# Patient Record
Sex: Male | Born: 1995
Health system: Southern US, Community
[De-identification: ages and names within clinical notes are randomized; demographics above are authoritative.]

## PROBLEM LIST (undated history)

## (undated) DIAGNOSIS — I10 Essential (primary) hypertension: Secondary | ICD-10-CM

## (undated) DIAGNOSIS — F419 Anxiety disorder, unspecified: Secondary | ICD-10-CM

## (undated) DIAGNOSIS — E785 Hyperlipidemia, unspecified: Secondary | ICD-10-CM

## (undated) DIAGNOSIS — J45909 Unspecified asthma, uncomplicated: Secondary | ICD-10-CM

## (undated) DIAGNOSIS — F32A Depression, unspecified: Secondary | ICD-10-CM

## (undated) DIAGNOSIS — F909 Attention-deficit hyperactivity disorder, unspecified type: Secondary | ICD-10-CM

## (undated) HISTORY — DX: Unspecified asthma, uncomplicated: J45.909

## (undated) HISTORY — DX: Anxiety disorder, unspecified: F41.9

## (undated) HISTORY — DX: Essential (primary) hypertension: I10

## (undated) HISTORY — DX: Depression, unspecified: F32.A

## (undated) HISTORY — DX: Hyperlipidemia, unspecified: E78.5

## (undated) HISTORY — DX: Attention-deficit hyperactivity disorder, unspecified type: F90.9

## (undated) HISTORY — PX: TONSILLECTOMY: SUR1361

---
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
## 2016-03-31 ENCOUNTER — Ambulatory Visit: Payer: Self-pay | Admitting: Family Medicine

## 2016-04-17 ENCOUNTER — Encounter: Payer: Self-pay | Admitting: Family Medicine

## 2016-04-17 ENCOUNTER — Ambulatory Visit (INDEPENDENT_AMBULATORY_CARE_PROVIDER_SITE_OTHER): Payer: Self-pay | Admitting: Family Medicine

## 2016-04-17 VITALS — BP 124/72 | HR 68 | Temp 98.3°F | Resp 14 | Ht 65.0 in | Wt 212.0 lb

## 2016-04-17 DIAGNOSIS — F329 Major depressive disorder, single episode, unspecified: Secondary | ICD-10-CM

## 2016-04-17 DIAGNOSIS — E669 Obesity, unspecified: Secondary | ICD-10-CM

## 2016-04-17 DIAGNOSIS — Z6379 Other stressful life events affecting family and household: Secondary | ICD-10-CM

## 2016-04-17 DIAGNOSIS — F32A Depression, unspecified: Secondary | ICD-10-CM

## 2016-04-17 DIAGNOSIS — E781 Pure hyperglyceridemia: Secondary | ICD-10-CM

## 2016-04-17 LAB — COMPREHENSIVE METABOLIC PANEL
ALBUMIN: 4.5 g/dL (ref 3.6–5.1)
ALK PHOS: 70 U/L (ref 48–230)
ALT: 17 U/L (ref 8–46)
AST: 20 U/L (ref 12–32)
BILIRUBIN TOTAL: 0.4 mg/dL (ref 0.2–1.1)
BUN: 12 mg/dL (ref 7–20)
CALCIUM: 9.3 mg/dL (ref 8.9–10.4)
CO2: 25 mmol/L (ref 20–31)
Chloride: 105 mmol/L (ref 98–110)
Creat: 0.94 mg/dL (ref 0.60–1.26)
Glucose, Bld: 75 mg/dL (ref 70–99)
Potassium: 4.4 mmol/L (ref 3.8–5.1)
Sodium: 143 mmol/L (ref 135–146)
Total Protein: 6.7 g/dL (ref 6.3–8.2)

## 2016-04-17 LAB — LIPID PANEL
CHOLESTEROL: 147 mg/dL (ref 125–170)
HDL: 30 mg/dL — ABNORMAL LOW (ref 31–65)
LDL Cholesterol: 85 mg/dL (ref <110)
TRIGLYCERIDES: 158 mg/dL — AB (ref 38–152)
Total CHOL/HDL Ratio: 4.9 Ratio (ref <=5.0)
VLDL: 32 mg/dL — ABNORMAL HIGH (ref <30)

## 2016-04-17 NOTE — Assessment & Plan Note (Signed)
He declines medication but agrees to therapy, will set up ASAP He does not have any active suicidal ideations or plans. Discussed with him that he can always go to the emergency room to get help if those thoughts were ever surface. He is a very polite young gentleman and is really trying to make a change for himself. He once into the [employer] and is currently working on getting his weight down and getting some closure with regards to his family's issues so he feels like he can move on.

## 2016-04-17 NOTE — Patient Instructions (Addendum)
Referral to therapy  School note today  F/U 2 months

## 2016-04-17 NOTE — Progress Notes (Signed)
Patient ID: Lavonna MonarchJeremy H Arakelian, male   DOB: 04/21/1996, 20 y.o.   MRN: [id number]   Subjective:    Patient ID: Lavonna MonarchJeremy H Ozburn, male    DOB: 12/07/1995, 20 y.o.   MRN: [id number]  Patient presents for Anxiety Patient to follow-up. He is history of anxiety and depressed mood. In the past we tried antidepressants but this made him feel like he was out of control at times he had suicidal thoughts. He does not want to be on any antidepressants but once referral to a therapist. He is has issues with his parents being divorced and his father doing well with his new wife and his mother is still having issues with drugs in financial stability. Also recently his sister moved out of the home with her son and the family is worried about the nephew because she is moved in with someone they think has a bad influence over her. He states that his father tells him that he is proud of him as he is now graduating from high school he is working he is also improving his health has lost weight but he states that he still does not feel like he has done the right things to deserve his approval. He states that he is followed to suffer many years and even his family doesn't know how bad he suffers with anxiety and stress over day-to-day details feeling like he's not fitting in and the divorce of his parents.    Review Of Systems:  GEN- denies fatigue, fever, weight loss,weakness, recent illness HEENT- denies eye drainage, change in vision, nasal discharge, CVS- denies chest pain, palpitations RESP- denies SOB, cough, wheeze ABD- denies N/V, change in stools, abd pain GU- denies dysuria, hematuria, dribbling, incontinence MSK- denies joint pain, muscle aches, injury Neuro- denies headache, dizziness, syncope, seizure activity       Objective:    BP 124/72 mmHg  Pulse 68  Temp(Src) 98.3 F (36.8 C) (Oral)  Resp 14  Ht 5\' 5"  (1.651 m)  Wt 212 lb (96.163 kg)  BMI 35.28 kg/m2 GEN- NAD, alert and oriented x3 Psych- very  polite, good eye contact, no SI/HI, not anxious appearing        Assessment & Plan:      Problem List Items Addressed This Visit    Obesity   Hypertriglyceridemia    Recheck labs      Relevant Orders   Lipid panel   Comprehensive metabolic panel   Depression - Primary    He declines medication but agrees to therapy, will set up ASAP He does not have any active suicidal ideations or plans. Discussed with him that he can always go to the emergency room to get help if those thoughts were ever surface. He is a very polite young gentleman and is really trying to make a change for himself. He once into the [employer] and is currently working on getting his weight down and getting some closure with regards to his family's issues so he feels like he can move on.      Relevant Orders   Ambulatory referral to Psychology    Other Visit Diagnoses    Stressful life event affecting family        Relevant Orders    Ambulatory referral to Psychology       Note: This dictation was prepared with Dragon dictation along with smaller phrase technology. Any transcriptional errors that result from this process are unintentional.

## 2016-04-17 NOTE — Assessment & Plan Note (Signed)
Recheck labs 

## 2016-04-18 ENCOUNTER — Encounter: Payer: Self-pay | Admitting: Family Medicine

## 2016-04-19 ENCOUNTER — Encounter: Payer: Self-pay | Admitting: *Deleted

## 2016-04-28 ENCOUNTER — Telehealth: Payer: Self-pay | Admitting: Family Medicine

## 2016-04-28 NOTE — Telephone Encounter (Signed)
error 

## 2016-06-19 ENCOUNTER — Ambulatory Visit: Payer: Self-pay | Admitting: Family Medicine

## 2018-09-05 ENCOUNTER — Encounter: Payer: Self-pay | Admitting: Physician Assistant

## 2018-09-05 ENCOUNTER — Ambulatory Visit (INDEPENDENT_AMBULATORY_CARE_PROVIDER_SITE_OTHER): Payer: BLUE CROSS/BLUE SHIELD | Admitting: Physician Assistant

## 2018-09-05 VITALS — BP 142/88 | HR 63 | Temp 97.6°F | Resp 16 | Ht 65.0 in | Wt 201.2 lb

## 2018-09-05 DIAGNOSIS — R040 Epistaxis: Secondary | ICD-10-CM

## 2018-09-05 NOTE — Progress Notes (Signed)
    Patient ID: Jose Vincent MRN: [id number], DOB: 1996-07-13, 22 y.o. Date of Encounter: 09/05/2018, 9:42 AM    Chief Complaint:  Chief Complaint  Patient presents with  . Epistaxis    Patient states he works in dry air at work.     HPI: 22 y.o. year old male presents with above.   He reports that he has been having episodes of epistaxis for several years but episodes are becoming more frequent. Reports that he currently is working third shift and has been working third shift at [employer] for about 1 year. States that, on average, he is now having about 3 nosebleeds per week. Reports that his grandmother has told him that his father also had nosebleeds, that he thinks that there is some hereditary component. Reports that when he is at work--- if blood gets on objects--- then it is then contaminated-- so that is an issue. Asked if he had been trying any type of treatment.  He responds that--- no--- he has been doing nothing.  He states that when the nose starts to bleed--- that it will bleed thick, heavy blood and he just lets it bleed and then it will stop spontaneously.  He does not apply pressure to the nose.  He does not apply any ice to the nose.  He does nothing.  He just lets that bleed and then it will eventually stop bleeding on its own.     Home Meds:   No outpatient medications prior to visit.   No facility-administered medications prior to visit.     Allergies:  Allergies  Allergen Reactions  . Amoxicillin-Pot Clavulanate Hives      Review of Systems: See HPI for pertinent ROS. All other ROS negative.    Physical Exam: Blood pressure (!) 158/90, pulse 63, temperature 97.6 F (36.4 C), temperature source Oral, resp. rate 16, height 5\' 5"  (1.651 m), weight 91.3 kg, SpO2 97 %., Body mass index is 33.49 kg/m. General: WM.  Appears in no acute distress. HEENT: Normocephalic, atraumatic.  Nose: Right Nares: Mucosa appears erythema, inflamed.  Left Nares:  Septal side with dried blood present. Lateral side with no blood.  No lesions visualized. Neck: Supple. No thyromegaly. No lymphadenopathy. Lungs: Clear bilaterally to auscultation without wheezes, rales, or rhonchi. Breathing is unlabored. Heart: Regular rhythm. No murmurs, rubs, or gallops. Msk:  Strength and tone normal for age. Extremities/Skin: Warm and dry. Neuro: Alert and oriented X 3. Moves all extremities spontaneously. Gait is normal. CNII-XII grossly in tact. Psych:  Responds to questions appropriately with a normal affect.     ASSESSMENT AND PLAN:  22 y.o. year old male with   1. Epistaxis I reviewed CBC from 2016 was normal with normal platelets. He is having no other bleeding so do doubt bleeding disorder. Discussed anatomy of vessels and nose with patient. Discussed that he has not even tried conservative measures of treatment.  Gust whether he wanted to try this first prior to her referral to ENT for possible cautery.  Wants me to go ahead and put in the referral to ENT.  Encouraged him to use conservative treatment measures in the interim.  Applied normal saline to the nose routinely to keep moist.  If he does develop a nosebleed needs to apply pressure and could apply ice/cold to encourage vasoconstriction. - Ambulatory referral to ENT   Signed, Frazier Richards, PA, [facility] 09/05/2018 9:42 AM

## 2018-10-17 ENCOUNTER — Ambulatory Visit (INDEPENDENT_AMBULATORY_CARE_PROVIDER_SITE_OTHER): Payer: BLUE CROSS/BLUE SHIELD | Admitting: Otolaryngology

## 2018-10-17 DIAGNOSIS — R040 Epistaxis: Secondary | ICD-10-CM | POA: Diagnosis not present

## 2018-10-21 ENCOUNTER — Ambulatory Visit (INDEPENDENT_AMBULATORY_CARE_PROVIDER_SITE_OTHER): Payer: BLUE CROSS/BLUE SHIELD | Admitting: Otolaryngology

## 2018-10-21 DIAGNOSIS — R040 Epistaxis: Secondary | ICD-10-CM

## 2018-11-18 ENCOUNTER — Ambulatory Visit (INDEPENDENT_AMBULATORY_CARE_PROVIDER_SITE_OTHER): Payer: Self-pay | Admitting: Otolaryngology

## 2018-11-28 ENCOUNTER — Ambulatory Visit (INDEPENDENT_AMBULATORY_CARE_PROVIDER_SITE_OTHER): Payer: Self-pay | Admitting: Otolaryngology

## 2019-02-04 ENCOUNTER — Ambulatory Visit (INDEPENDENT_AMBULATORY_CARE_PROVIDER_SITE_OTHER): Payer: BLUE CROSS/BLUE SHIELD | Admitting: Family Medicine

## 2019-02-04 VITALS — BP 130/80 | HR 60 | Temp 97.6°F | Resp 14 | Ht 66.0 in | Wt 211.0 lb

## 2019-02-04 DIAGNOSIS — R21 Rash and other nonspecific skin eruption: Secondary | ICD-10-CM

## 2019-02-04 MED ORDER — DICLOFENAC SODIUM 75 MG PO TBEC: 75.0000 mg | DELAYED_RELEASE_TABLET | Freq: Two times a day (BID) | ORAL | 0 refills | Status: DC

## 2019-02-04 NOTE — Progress Notes (Signed)
Subjective:    Patient ID: Jose Vincent, male    DOB: July 20, 1996, 23 y.o.   MRN: [id number]  HPI  Patient reports 1 week history of pain at the base of his penis.  The pain is adjacent to the shaft of the penis on the left-hand side.  He denies any specific injuries however the pain is worse with sexual intercourse.  It is also worse with heavy lifting.  It comes and goes.  In that area there are 3 small bumps.  Each of the bumps are approximately 1 mm in diameter.  There erythematous papules.  They do not itch or burn.  The rash just showed up on Saturday. Past Medical History:  Diagnosis Date  . ADHD (attention deficit hyperactivity disorder)    On Concerta /Vyavnse in past  . Asthma    Childhood- no inhaler since elementary age  . Hyperlipidemia    Past Surgical History:  Procedure Laterality Date  . TONSILLECTOMY     No current outpatient medications on file prior to visit.   No current facility-administered medications on file prior to visit.    Allergies  Allergen Reactions  . Amoxicillin-Pot Clavulanate Hives   Social History   Socioeconomic History  . Marital status: Single    Spouse name: Not on file  . Number of children: Not on file  . Years of education: Not on file  . Highest education level: Not on file  Occupational History  . Not on file  Social Needs  . Financial resource strain: Not on file  . Food insecurity:    Worry: Not on file    Inability: Not on file  . Transportation needs:    Medical: Not on file    Non-medical: Not on file  Tobacco Use  . Smoking status: Current Every Day Smoker    Packs/day: 0.25    Types: E-cigarettes  . Smokeless tobacco: Never Used  Substance and Sexual Activity  . Alcohol use: No    Alcohol/week: 0.0 standard drinks  . Drug use: No    Frequency: 3.0 times per week  . Sexual activity: Not Currently  Lifestyle  . Physical activity:    Days per week: Not on file    Minutes per session: Not on file  . Stress:  Not on file  Relationships  . Social connections:    Talks on phone: Not on file    Gets together: Not on file    Attends religious service: Not on file    Active member of club or organization: Not on file    Attends meetings of clubs or organizations: Not on file    Relationship status: Not on file  . Intimate partner violence:    Fear of current or ex partner: Not on file    Emotionally abused: Not on file    Physically abused: Not on file    Forced sexual activity: Not on file  Other Topics Concern  . Not on file  Social History Narrative   Live with grandmother      Review of Systems  All other systems reviewed and are negative.      Objective:   Physical Exam Vitals signs reviewed.  Constitutional:      General: He is not in acute distress.    Appearance: Normal appearance. He is not ill-appearing.  Cardiovascular:     Rate and Rhythm: Normal rate and regular rhythm.     Heart sounds: Normal heart sounds.  Pulmonary:  Effort: Pulmonary effort is normal.     Breath sounds: Normal breath sounds.  Abdominal:     Hernia: There is no hernia in the right inguinal area or left inguinal area.  Genitourinary:    Penis: Normal and circumcised. No erythema, discharge or lesions.      Scrotum/Testes:        Right: Mass, tenderness or swelling not present.        Left: Mass, tenderness or swelling not present.     Epididymis:     Right: Normal.     Left: Normal.    Lymphadenopathy:     Lower Body: No right inguinal adenopathy. No left inguinal adenopathy.  Neurological:     Mental Status: He is alert.           Assessment & Plan:  Rash of genital area - Plan: HSV(herpes smplx)abs-1+2(IgG+IgM)-bld, HIV Antibody (routine testing w rflx), RPR  I honestly believe that the pain adjacent to the shaft of his penis is likely muscular in nature.  I cannot appreciate any hernia today on exam with Valsalva.  A small hernia would also be a possibility however I think  the patient may have strained a muscle in this area and this should gradually improve over the next 1 to 2 weeks.  I recommended diclofenac 75 mg p.o. twice daily as needed for pain.  The rash looks like some 3 small heat rash bumps.  Although I cannot rule out herpes particular given the pain.  Therefore I will check a blood test for herpes.  These are not vesicular lesions however and there is no fluid to sample to run a viral culture on.  While drawing lab work, the patient would also like to be screened for HIV and syphilis.  I recommended treating the rash which I believe is a heat rash with hydrocortisone cream twice daily for 1 week and to treat the pain in the groin which I believe is muscular with diclofenac 75 mg twice daily

## 2019-02-05 LAB — HIV ANTIBODY (ROUTINE TESTING W REFLEX): HIV: NONREACTIVE

## 2019-02-05 LAB — RPR: RPR Ser Ql: NONREACTIVE

## 2019-05-26 ENCOUNTER — Telehealth: Payer: Self-pay

## 2019-05-26 ENCOUNTER — Other Ambulatory Visit: Payer: BLUE CROSS/BLUE SHIELD

## 2019-05-26 ENCOUNTER — Other Ambulatory Visit: Payer: Self-pay

## 2019-05-26 DIAGNOSIS — Z20822 Contact with and (suspected) exposure to covid-19: Secondary | ICD-10-CM

## 2019-05-26 DIAGNOSIS — R6889 Other general symptoms and signs: Secondary | ICD-10-CM | POA: Diagnosis not present

## 2019-05-26 NOTE — Telephone Encounter (Signed)
rec'd request from [facility]. HD. To schedule pt. For CoVID testing.    Called pt. Offered to schedule for COVID testing.  Pt. Stated he was sent from the nurse at work to the testing site, and already went there this morning.  Questioned if he has to continue to quarantine until test results come back.  Stated he was nauseated and had a fever of 99.1 yesterday.  Stated his stomach is still a little upset today.  Advised to continue to quarantine, until test results are confirmed.  Verb. Understanding.

## 2019-05-26 NOTE — Progress Notes (Signed)
[id number] 

## 2019-05-30 ENCOUNTER — Telehealth: Payer: Self-pay

## 2019-05-30 ENCOUNTER — Telehealth: Payer: Self-pay | Admitting: Family Medicine

## 2019-05-30 NOTE — Telephone Encounter (Signed)
Pt. Calling for results - not available.

## 2019-05-30 NOTE — Telephone Encounter (Signed)
No results as of now. Informed patient we would call with results as soon as available.

## 2019-05-31 LAB — NOVEL CORONAVIRUS, NAA: SARS-CoV-2, NAA: NOT DETECTED

## 2019-10-02 ENCOUNTER — Telehealth: Payer: Self-pay | Admitting: Family Medicine

## 2019-10-02 NOTE — Telephone Encounter (Signed)
Patient was at work last night, was vaping and all of a sudden started coughing really bad, starting sweating, starting feeling bad, his work sent him home for covid restrictions would like advice as to what to do next  [phone]

## 2019-10-02 NOTE — Telephone Encounter (Signed)
Call placed to patient to inquire.   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 Reports that he took a break around 5:30PM and was resting and vaping. States that he began to feel jittery, fatigued, broke out in a sweat, and had cough with some phlegm production. States that it feels like there is a lump in his throat when he coughs. States that he was sent home by supervisor.   Symptoms appear to be multifactorial. Patient reports that he last ate at 9am prior to going to work, and only had energy drink at 11am prior to onset of symptoms at 5:30pm. Patient also states that he has been vaping for >1 year. Suspect that he had drop is blood sugar leading to sweating and feeling jittery/ fatigued.   Reports that VS are as follows: BP 141/86, HR 71, T 98.0 Oral  States that he was not required to have COVID testing unless PCP recommended. Advised that Sx do not sound like COVID. Offered Telehealth visit with PCP to evaluate. Appointment scheduled.

## 2019-10-03 ENCOUNTER — Ambulatory Visit (INDEPENDENT_AMBULATORY_CARE_PROVIDER_SITE_OTHER): Payer: BC Managed Care – PPO | Admitting: Family Medicine

## 2019-10-03 ENCOUNTER — Encounter: Payer: Self-pay | Admitting: Family Medicine

## 2019-10-03 ENCOUNTER — Other Ambulatory Visit: Payer: Self-pay

## 2019-10-03 DIAGNOSIS — R110 Nausea: Secondary | ICD-10-CM

## 2019-10-03 DIAGNOSIS — R05 Cough: Secondary | ICD-10-CM

## 2019-10-03 DIAGNOSIS — Z20822 Contact with and (suspected) exposure to covid-19: Secondary | ICD-10-CM

## 2019-10-03 DIAGNOSIS — Z20828 Contact with and (suspected) exposure to other viral communicable diseases: Secondary | ICD-10-CM

## 2019-10-03 DIAGNOSIS — R059 Cough, unspecified: Secondary | ICD-10-CM

## 2019-10-03 MED ORDER — ONDANSETRON 4 MG PO TBDP: 4.0000 mg | ORAL_TABLET | Freq: Three times a day (TID) | ORAL | 0 refills | Status: DC | PRN

## 2019-10-03 NOTE — Telephone Encounter (Signed)
noted 

## 2019-10-03 NOTE — Progress Notes (Signed)
Virtual Visit via Telephone Note  I connected with Jose Vincent on 10/03/19 at 11:02am  by telephone and verified that I am speaking with the correct person using two identifiers.      Pt location: at home   Physician location:  In office, [facility], Vic Blackbird MD     On call: patient and physician   I discussed the limitations, risks, security and privacy concerns of performing an evaluation and management service by telephone and the availability of in person appointments. I also discussed with the patient that there may be a patient responsible charge related to this service. The patient expressed understanding and agreed to proceed.   History of Present Illness:  He works outside. He was on his break early Thursday AM ( he works night shift) and he was vaping, then all of a sudden a felt a lump in his throat and felt like he couldn't breathe, he started coughing a lot then became sweaty and nausea. He felt fine before he went into his break at work and had used his vape earlier that day.  The spell lasted 10-15 minuts He has had COVID-19 at his work and has been in close proximetry with someone The lump in his throat improved yesterday, but still felt nauseated,  No further coughing  No vomiting or diarrhea  Temperature was  98.54F 145/81 at home  He does not take any meds or other supplements  M-F work night   His nausea however has persisted last night states that he just felt bad but cannot pinpoint anything particular.  Today he has not eaten anything because of the nausea.  He still does not feel quite himself.     Observations/Objective: No acute distress noted over the phone  Assessment and Plan: Nausea with coughing episode yesterday this could have been precipitated by his vape device.  However he has persistent nausea and general malaise with Covid positive in his workplace I recommend he go get Covid testing.  Have also sent over Zofran for the  nausea.  Otherwise supportive care with fluids and rest.  Patient voiced understanding  Follow Up Instructions:    I discussed the assessment and treatment plan with the patient. The patient was provided an opportunity to ask questions and all were answered. The patient agreed with the plan and demonstrated an understanding of the instructions.   The patient was advised to call back or seek an in-person evaluation if the symptoms worsen or if the condition fails to improve as anticipated.  I provided  13  minutes of non-face-to-face time during this encounter. End Time 11:15am   Vic Blackbird, MD

## 2019-10-05 LAB — NOVEL CORONAVIRUS, NAA: SARS-CoV-2, NAA: NOT DETECTED

## 2019-10-06 ENCOUNTER — Telehealth: Payer: Self-pay

## 2019-10-06 NOTE — Telephone Encounter (Signed)
Received call from patient checking Covid results.  Advised results negative.   

## 2019-11-11 ENCOUNTER — Encounter: Payer: Self-pay | Admitting: Family Medicine

## 2019-11-11 ENCOUNTER — Ambulatory Visit (INDEPENDENT_AMBULATORY_CARE_PROVIDER_SITE_OTHER): Payer: Self-pay | Admitting: Family Medicine

## 2019-11-11 ENCOUNTER — Other Ambulatory Visit: Payer: Self-pay

## 2019-11-11 DIAGNOSIS — J029 Acute pharyngitis, unspecified: Secondary | ICD-10-CM

## 2019-11-11 NOTE — Progress Notes (Signed)
Virtual Visit via Telephone Note  I connected with Jose Vincent on 11/11/19 at8:25am  by telephone and verified that I am speaking with the correct person using two identifiers.      Pt location: at home   Physician location:  In office, [facility], Vic Blackbird MD     On call: patient and physician   I discussed the limitations, risks, security and privacy concerns of performing an evaluation and management service by telephone and the availability of in person appointments. I also discussed with the patient that there may be a patient responsible charge related to this service. The patient expressed understanding and agreed to proceed.   History of Present Illness: Patient called in because of sore throat last week.  Last Tuesday he started having some mild sore throat states that he was working outside for his job and the air was cold.  He had some mild congestion and drainage.  His throat was sore for a few days but it worsened on Friday where he had some discomfort swallowing but then it went away on its own.  He has not had any sore throat for the past 4 days.  He never had any significant congestion, cough, fever ,body aches nausea vomiting loss of taste or smell or GI symptoms.  His work had him stay out of work until he was evaluated by me today.  Not sent for Covid testing.  He is actually been Covid tested twice the last was in October.  He has not had any known Covid contacts recently.  He feels fine needs note to return to work Observations/Objective: No acute distress noted over the phone  Assessment and Plan: Pharyngitis suspect this could have come from some postnasal drip he was working outside in the cold air.  He never had any other viral symptoms.  His symptoms have actually already resolved for the past 4 days.  I think that he can return to work.  I do not think that he needs Covid testing at this time.  I did advise him if he does get new symptoms to let  us know.  Follow Up Instructions:    I discussed the assessment and treatment plan with the patient. The patient was provided an opportunity to ask questions and all were answered. The patient agreed with the plan and demonstrated an understanding of the instructions.   The patient was advised to call back or seek an in-person evaluation if the symptoms worsen or if the condition fails to improve as anticipated.  I provided 5 minutes of non-face-to-face time during this encounter. End Time 8:30am  Vic Blackbird, MD

## 2020-02-25 ENCOUNTER — Ambulatory Visit: Payer: Managed Care, Other (non HMO) | Attending: Internal Medicine

## 2020-02-25 ENCOUNTER — Telehealth (INDEPENDENT_AMBULATORY_CARE_PROVIDER_SITE_OTHER): Payer: Managed Care, Other (non HMO) | Admitting: Nurse Practitioner

## 2020-02-25 DIAGNOSIS — Z20822 Contact with and (suspected) exposure to covid-19: Secondary | ICD-10-CM | POA: Insufficient documentation

## 2020-02-25 DIAGNOSIS — B349 Viral infection, unspecified: Secondary | ICD-10-CM

## 2020-02-25 NOTE — Progress Notes (Signed)
Virtual Note    I connected withJeremy Marcell Vincent  on 02/25/2020 at 1112 by videoand verified that I am speaking with the correct person using two identifiers.  Pt location: at home   Physician location:  In office, [place], Ishmael Holter, FNP-C    On call: patient and physician   I discussed the limitations, risks, security and privacy concerns of performing an evaluation and management service by telephone and the availability of in person appointments. I also discussed with the patient that there may be a patient responsible charge related to this service. The patient expressed understanding and agreed to proceed.   History of Present Illness: Pt is a 24 year old caucasian male presenting for telephone visit related to onset of sxs nausea, general body aches, sneezing and h/a same as prior headaches started last night.  He does note that he experienced hot flashes yesterday afternoon. This AM upon waking he had sore throat that has now resolved. No fever/chills, nasal congestion. No loss of taste or smell. No other gu/gi sxs, no sick contacts. Pt has tried no treatment.   Observations/Objective: NAD noted, able to speak in full sentences, no visual sob or use of accessory muscles to breathe, not ill appearing  Assessment and Plan: 1. Your sxs are consistent with a Viral illness 2. Covid test today number for [facility] testing provided 3. May take over the counter medications for sxs relief such as tylenol/ibuprofen 4. Get plenty of fluids and rest  Symptoms vary depending on the type of virus and the location of the cells that it invades. Common symptoms of the main types of viral illnesses include: Cold and flu viruses  Fever.  Headache.  Sore throat.  Muscle aches.  Nasal congestion.  Cough. Digestive system (gastrointestinal) viruses  Fever.  Abdominal pain.  Nausea.  Diarrhea. Liver viruses (hepatitis)  Loss of  appetite.  Tiredness.  Yellowing of the skin (jaundice). Brain and spinal cord viruses  Fever.  Headache.  Stiff neck.  Nausea and vomiting.  Confusion or sleepiness. Skin viruses  Warts.  Itching.  Rash. Sexually transmitted viruses  Discharge.  Swelling.  Redness.  Rash. How is this treated? Viruses can be difficult to treat because they live within cells. Antibiotic medicines do not treat viruses because these drugs do not get inside cells. Treatment for a viral illness may include:  Resting and drinking plenty of fluids.  Medicines to relieve symptoms. These can include over-the-counter medicine for pain and fever, medicines for cough or congestion, and medicines to relieve diarrhea.  Antiviral medicines. These drugs are available only for certain types of viruses. They may help reduce flu symptoms if taken early. There are also many antiviral medicines for hepatitis and HIV/AIDS. Some viral illnesses can be prevented with vaccinations. A common example is the flu shot. Follow these instructions at home: Medicines    Take over-the-counter and prescription medicines only as told by your health care provider.  If you were prescribed an antiviral medicine, take it as told by your health care provider. Do not stop taking the medicine even if you start to feel better.  Be aware of when antibiotics are needed and when they are not needed. Antibiotics do not treat viruses. If your health care provider thinks that you may have a bacterial infection as well as a viral infection, you may get an antibiotic. ? Do not ask for an antibiotic prescription if you have been diagnosed with a viral illness. That will  not make your illness go away faster. ? Frequently taking antibiotics when they are not needed can lead to antibiotic resistance. When this develops, the medicine no longer works against the bacteria that it normally fights. General instructions  Drink enough  fluids to keep your urine clear or pale yellow.  Rest as much as possible.  Return to your normal activities as told by your health care provider. Ask your health care provider what activities are safe for you.  Keep all follow-up visits as told by your health care provider. This is important. How is this prevented? Take these actions to reduce your risk of viral infection:     Eat a healthy diet and get enough rest.  Wash your hands often with soap and water. This is especially important when you are in public places. If soap and water are not available, use hand sanitizer.  Avoid close contact with friends and family who have a viral illness.  If you travel to areas where viral gastrointestinal infection is common, avoid drinking water or eating raw food.  Keep your immunizations up to date. Get a flu shot every year as told by your health care provider.  Do not share toothbrushes, nail clippers, razors, or needles with other people.  Always practice safe sex.  Contact a health care provider if:  You have symptoms of a viral illness that do not go away.  Your symptoms come back after going away.  Your symptoms get worse. Get help right away if:  You have trouble breathing.  You have a severe headache or a stiff neck.  You have severe vomiting or abdominal pain. This information is not intended to replace advice given to you by your health care provider. Make sure you discuss any questions you have with your health care provider. Document Revised: 11/02/2017 Document Reviewed: 03/31/2016 Elsevier Patient Education  2020 [employer].   Follow Up Instructions: as needed for worsening/non resolving sxs.  I discussed the assessment and treatment plan with the patient. The patient was provided an opportunity to ask questions and all were answered. The patient agreed with the plan and demonstrated an understanding of the instructions.  The patient was advised to  call back or seek an in-person evaluation if the symptoms worsen or if the condition fails to improve as anticipated.  I provided 10 minutes of non-face-to-face time during this encounter. End Time 1122   Lawson Fiscal, FNP-C

## 2020-02-26 LAB — SARS-COV-2, NAA 2 DAY TAT

## 2020-02-26 LAB — NOVEL CORONAVIRUS, NAA: SARS-CoV-2, NAA: NOT DETECTED

## 2020-08-11 ENCOUNTER — Telehealth: Payer: Managed Care, Other (non HMO) | Admitting: Nurse Practitioner

## 2020-08-11 ENCOUNTER — Other Ambulatory Visit: Payer: Self-pay

## 2020-08-11 ENCOUNTER — Telehealth: Payer: Self-pay | Admitting: Nurse Practitioner

## 2020-08-11 DIAGNOSIS — Z20822 Contact with and (suspected) exposure to covid-19: Secondary | ICD-10-CM | POA: Diagnosis not present

## 2020-08-11 NOTE — Progress Notes (Signed)
Virtual Visit via Video Note  I connected with Jose Vincent on 08/11/20 at 10:30 AM EDT by a video enabled telemedicine application and verified that I am speaking with the correct person using two identifiers.   I discussed the limitations of evaluation and management by telemedicine and the availability of in person appointments. The patient expressed understanding and agreed to proceed. Pt was at home and Provider located at clinic [facility].   History of Present Illness: Pt is a 24 year old male presenting for a telephone visit related to being exposed to [place]. He is not having any sxs. His exposure was to grandmother with confirmed COVID positive on Saturday. He was instructed by work to get tested. Also his girlfriend has sxs of body pain not been tested yet.   Not vaccinated.  Time spent discussing exposure, Infection control, pts boss's instruction, CDC COVID guidelines  Observations/Objective:  Past Medical History:  Diagnosis Date  . ADHD (attention deficit hyperactivity disorder)    On Concerta /Vyavnse in past  . Asthma    Childhood- no inhaler since elementary age  . Hyperlipidemia    Past Surgical History:  Procedure Laterality Date  . TONSILLECTOMY     No current outpatient medications on file prior to visit.   No current facility-administered medications on file prior to visit.   Immunization History  Administered Date(s) Administered  . DTaP 08/12/1997, 10/12/1997, 12/23/1997, 06/22/1998, 03/24/2002  . Hepatitis B 1997/09/07, 08/12/1997, 12/23/1997  . HiB (PRP-OMP) 08/12/1997, 10/12/1997, 12/23/1997, 06/22/1998  . IPV 08/12/1997, 10/12/1997, 03/24/2002  . MMR 06/22/1998, 03/24/2002  . Meningococcal B, OMV 02/01/2015, 07/02/2015  . Meningococcal Conjugate 02/01/2015  . Td 08/12/2009  . Varicella 07/02/2015   Allergies  Allergen Reactions  . Amoxicillin Hives  . Amoxicillin-Pot Clavulanate Hives     Assessment and Plan: Close  exposure to COVID-19 virus  Time spent in consultation 15 minutes quarantine for 14 days from onset of your family member who tested positive systoms onset. If you develop sxs during that time you should COVID test. If you test positive your quarentine 14 day will start over from onset of your symptoms.   Follow Up Instructions:    I discussed the assessment and treatment plan with the patient. The patient was provided an opportunity to ask questions and all were answered. The patient agreed with the plan and demonstrated an understanding of the instructions.   The patient was advised to call back or seek an in-person evaluation if the symptoms worsen or if the condition fails to improve as anticipated.  I provided 15 minutes of non-face-to-face time during this encounter. 10:45  Annie Main, FNP

## 2020-08-11 NOTE — Telephone Encounter (Signed)
Please add work note: exposure to family member with covid. Quarantine for 14 days from 08/06/2020.

## 2020-08-11 NOTE — Telephone Encounter (Signed)
Letter sent to pt

## 2020-09-20 ENCOUNTER — Emergency Department (HOSPITAL_COMMUNITY)
Admission: EM | Admit: 2020-09-20 | Discharge: 2020-09-20 | Disposition: A | Discharge status: Home / Self Care | Payer: Managed Care, Other (non HMO) | Source: Home / Self Care | Attending: Emergency Medicine | Admitting: Emergency Medicine

## 2020-09-20 ENCOUNTER — Emergency Department (HOSPITAL_COMMUNITY): Payer: Managed Care, Other (non HMO)

## 2020-09-20 ENCOUNTER — Other Ambulatory Visit: Payer: Self-pay

## 2020-09-20 ENCOUNTER — Emergency Department (HOSPITAL_COMMUNITY)
Admission: EM | Admit: 2020-09-20 | Discharge: 2020-09-20 | Disposition: A | Discharge status: Home / Self Care | Payer: Managed Care, Other (non HMO) | Attending: Emergency Medicine | Admitting: Emergency Medicine

## 2020-09-20 ENCOUNTER — Encounter (HOSPITAL_COMMUNITY): Payer: Self-pay

## 2020-09-20 ENCOUNTER — Encounter (HOSPITAL_COMMUNITY): Payer: Self-pay | Admitting: Emergency Medicine

## 2020-09-20 DIAGNOSIS — J45909 Unspecified asthma, uncomplicated: Secondary | ICD-10-CM | POA: Insufficient documentation

## 2020-09-20 DIAGNOSIS — Y9289 Other specified places as the place of occurrence of the external cause: Secondary | ICD-10-CM | POA: Insufficient documentation

## 2020-09-20 DIAGNOSIS — F17290 Nicotine dependence, other tobacco product, uncomplicated: Secondary | ICD-10-CM | POA: Insufficient documentation

## 2020-09-20 DIAGNOSIS — T1490XA Injury, unspecified, initial encounter: Secondary | ICD-10-CM

## 2020-09-20 DIAGNOSIS — Y9389 Activity, other specified: Secondary | ICD-10-CM | POA: Insufficient documentation

## 2020-09-20 DIAGNOSIS — S82424A Nondisplaced transverse fracture of shaft of right fibula, initial encounter for closed fracture: Secondary | ICD-10-CM | POA: Diagnosis not present

## 2020-09-20 DIAGNOSIS — S93401A Sprain of unspecified ligament of right ankle, initial encounter: Secondary | ICD-10-CM | POA: Insufficient documentation

## 2020-09-20 DIAGNOSIS — X501XXA Overexertion from prolonged static or awkward postures, initial encounter: Secondary | ICD-10-CM | POA: Diagnosis not present

## 2020-09-20 DIAGNOSIS — W34011A Accidental discharge of paintball gun, initial encounter: Secondary | ICD-10-CM | POA: Insufficient documentation

## 2020-09-20 DIAGNOSIS — S82464D Nondisplaced segmental fracture of shaft of right fibula, subsequent encounter for closed fracture with routine healing: Secondary | ICD-10-CM

## 2020-09-20 DIAGNOSIS — S99921A Unspecified injury of right foot, initial encounter: Secondary | ICD-10-CM | POA: Diagnosis present

## 2020-09-20 DIAGNOSIS — S99811A Other specified injuries of right ankle, initial encounter: Secondary | ICD-10-CM

## 2020-09-20 MED ORDER — KETOROLAC TROMETHAMINE 60 MG/2ML IM SOLN
60.0000 mg | Freq: Once | INTRAMUSCULAR | Status: AC
  Administered 2020-09-20: 60 mg via INTRAMUSCULAR
  Filled 2020-09-20: qty 2

## 2020-09-20 MED ORDER — KETOROLAC TROMETHAMINE 10 MG PO TABS: 10.0000 mg | ORAL_TABLET | Freq: Four times a day (QID) | ORAL | 0 refills | Status: DC | PRN

## 2020-09-20 NOTE — ED Triage Notes (Signed)
Pt reports he rolled his right foot playing paintball x 1 day ago, moderate swelling and bruising noted

## 2020-09-20 NOTE — ED Triage Notes (Signed)
Pt presents to ED for right foot pain. Pt states he heard a crunch. Evelena Leyden PA in triage to see pt. +pedal pulse noted.

## 2020-09-20 NOTE — Discharge Instructions (Addendum)
You have been given a cam walker rather than crutches which will allow for better mobility.  Please follow-up with Dr. Romeo Apple, orthopedics.  I would also like you to follow-up with your primary care provider regarding today's encounter.  Please continue to elevate your affected leg.  Take Tylenol or ibuprofen as needed for pain symptoms.  Return to the ED or seek immediate medical attention should you experience any new or worsening symptoms.

## 2020-09-20 NOTE — ED Provider Notes (Signed)
[facility] EMERGENCY DEPARTMENT Provider Note   CSN: [id number] Arrival date & time: 09/20/20  1117     History Chief Complaint  Patient presents with  . Foot Injury    NYLE LIMB is a 24 y.o. male with no relevant past medical history presents the ED after rolling his ankle yesterday while playing paint ball.  Patient reports that he was in the woods when he everted his right foot.  He was able to ambulate yesterday, albeit with pain and discomfort.  Today he continued to endorse pain symptoms and had his friend drive him here to the ED for evaluation.  He endorses discomfort with weightbearing, tenderness, and feeling as though something is "pushing out" from the outside of his lower right leg.  He states that he has had swelling and bruising.  He denies any other injury.  No weakness.  His ROM is limited only by discomfort.  He does endorse diminished sensation in his toes.  No other symptoms or complaints.  Pain is relatively well controlled with ibuprofen.  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  HPI     Past Medical History:  Diagnosis Date  . ADHD (attention deficit hyperactivity disorder)    On Concerta /Vyavnse in past  . Asthma    Childhood- no inhaler since elementary age  . Hyperlipidemia     Patient Active Problem List   Diagnosis Date Noted  . Tobacco use disorder 10/01/2015  . Depression 07/02/2015  . Hypertriglyceridemia 06/08/2014  . Routine infant or child health check 03/06/2014  . Obesity 03/06/2014  . Flat foot 03/06/2014    Past Surgical History:  Procedure Laterality Date  . TONSILLECTOMY         Family History  Problem Relation Age of Onset  . Depression Mother   . Hyperlipidemia Father   . Hypertension Father   . Hyperlipidemia Maternal Grandmother   . Cancer Paternal Grandmother   . Hypertension Paternal Grandmother   . COPD Paternal Grandfather   . Depression Paternal Grandfather   . Heart disease  Paternal Grandfather   . Hypertension Paternal Grandfather   . Mental illness Paternal Grandfather     Social History   Tobacco Use  . Smoking status: Current Every Day Smoker    Packs/day: 0.25    Types: E-cigarettes  . Smokeless tobacco: Never Used  Vaping Use  . Vaping Use: Never used  Substance Use Topics  . Alcohol use: No    Alcohol/week: 0.0 standard drinks  . Drug use: No    Frequency: 3.0 times per week    Home Medications Prior to Admission medications   Not on File    Allergies    Amoxicillin and Amoxicillin-pot clavulanate  Review of Systems   Review of Systems  All other systems reviewed and are negative.   Physical Exam Updated Vital Signs BP (!) 137/91 (BP Location: Right Arm)   Pulse 83   Temp 98.1 F (36.7 C) (Oral)   Resp 20   Ht 5\' 7"  (1.702 m)   Wt 99.8 kg   SpO2 100%   BMI 34.46 kg/m   Physical Exam Vitals and nursing note reviewed. Exam conducted with a chaperone present.  Constitutional:      Appearance: Normal appearance.  HENT:     Head: Normocephalic and atraumatic.  Eyes:     General: No scleral icterus.    Conjunctiva/sclera: Conjunctivae normal.  Cardiovascular:     Rate and Rhythm: Normal rate.  Pulses: Normal pulses.  Pulmonary:     Effort: Pulmonary effort is normal.  Musculoskeletal:     Comments: Right leg: TTP over distal third of fibula.  Mild swelling, but compartments are soft.  No tibial TTP.  ROM of knee and hip fully intact.  Dorsiflexion and plantar flexion of right ankle mildly limited due to pain and swelling.  Ecchymoses noted under medial malleolus.  No significant TTP involving foot or ankle.  Sensation intact distally.  Pedal pulse intact and symmetric with contralateral side.  Capillary refill less than 2 seconds.  Skin:    General: Skin is dry.     Capillary Refill: Capillary refill takes less than 2 seconds.  Neurological:     Mental Status: He is alert and oriented to person, place, and time.      GCS: GCS eye subscore is 4. GCS verbal subscore is 5. GCS motor subscore is 6.  Psychiatric:        Mood and Affect: Mood normal.        Behavior: Behavior normal.        Thought Content: Thought content normal.     ED Results / Procedures / Treatments   Labs (all labs ordered are listed, but only abnormal results are displayed) Labs Reviewed - No data to display  EKG None  Radiology DG Ankle Complete Right  Result Date: 09/20/2020 CLINICAL DATA:  Right foot and ankle pain, swelling and bruising since a slip and fall yesterday playing paint ball. Initial encounter. EXAM: RIGHT ANKLE - COMPLETE 3+ VIEW COMPARISON:  None. FINDINGS: The patient has an acute fracture of the distal diaphysis of the fibula with a butterfly fragment posteriorly. The fracture is minimally displaced. Soft tissues are swollen IMPRESSION: Acute fracture of the distal diaphysis of the right fibula with soft tissue swelling as described. Electronically Signed   By: Drusilla Kanner M.D.   On: 09/20/2020 11:53   DG Foot Complete Right  Result Date: 09/20/2020 CLINICAL DATA:  Right foot and ankle pain, swelling and bruising since a slip and fall yesterday playing paint ball. Initial encounter. EXAM: RIGHT FOOT COMPLETE - 3+ VIEW COMPARISON:  None. FINDINGS: Acute distal fibular fracture is partially visualized. There is no fracture or dislocation of the foot. There is no evidence of arthropathy or other focal bone abnormality. Soft tissues of the foot are swollen. IMPRESSION: Acute distal fibular fracture.  Negative for foot fracture. Soft tissue swelling about the foot. Electronically Signed   By: Drusilla Kanner M.D.   On: 09/20/2020 11:51    Procedures Procedures (including critical care time)  Medications Ordered in ED Medications - No data to display  ED Course  I have reviewed the triage vital signs and the nursing notes.  Pertinent labs & imaging results that were available during my care of the  patient were reviewed by me and considered in my medical decision making (see chart for details).    MDM Rules/Calculators/A&P                          Patient sustained right foot eversion injury.  Plain films obtained of foot and ankle are personally reviewed and demonstrate an acute distal fibular fracture.  No tenderness appreciated over proximal fibula or tibia.  ROM of knee fully intact.  No other obvious injuries.  No other fractures or other osseous abnormalities seen on plain films.  Patient states that he has been weightbearing, albeit with discomfort.  He is neurovascularly intact, albeit with mildly diminished sensation in his toes which is likely related to his inflammation.  Will place in cam walker and have him follow-up with local orthopedist, Dr. Romeo Apple.  Patient can also follow-up with his primary care provider regarding today's encounter.  He is not requesting pain medications at this time and feels as though his symptoms are managed well with Tylenol and ibuprofen at home.  ED return precautions discussed, particularly for worsening swelling/pain/pulselessness/numbness that might be otherwise concerning for developing compartment syndrome.  Compartments soft at this time.  Patient voices understanding and is agreeable to the plan.   Final Clinical Impression(s) / ED Diagnoses Final diagnoses:  Injury  Supination-eversion injury of ankle, right, initial encounter  Closed nondisplaced transverse fracture of shaft of right fibula, initial encounter    Rx / DC Orders ED Discharge Orders    None       Lorelee New, PA-C 09/20/20 1245    Blane Ohara, MD 09/20/20 1547

## 2020-09-20 NOTE — Discharge Instructions (Signed)
Please continue to elevate your affected leg.  You can apply ice to the affected area.  Please take the prescribed Toradol, as directed.  Do not combine with other NSAIDs.  Continue take Tylenol for additional pain relief.  Do not use the cam walker as this is obviously exacerbating your pain symptoms.  Instead, use the crutches.  Weightbearing only as tolerated, preferably avoid until you are evaluated by orthopedics in 3 days.  Please go to your appointment, as scheduled.  Return to the ED or seek immediate medical attention should you experience any new or worsening symptoms.

## 2020-09-20 NOTE — ED Notes (Signed)
Entered room and introduced self to patient at this time. Pt appears to be resting in bed with no obvious signs of distress noted. Bed is locked in the lowest position, side rails x2, call bell within reach. Pt provided with a ice pack per order at this time. Educated on call light and hourly and rounding and is in agreement. All questions and concerns voiced addressed at this time.

## 2020-09-20 NOTE — ED Provider Notes (Addendum)
[facility] EMERGENCY DEPARTMENT Provider Note   CSN: [id number] Arrival date & time: 09/20/20  1555     History Chief Complaint  Patient presents with  . Foot Pain    Jose Vincent is a 24 y.o. male who returns to the ED.  This provider personally evaluated patient a few hours prior after he sustained mechanical injury while playing paint ball.  Plain films were obtained of foot and ankle which demonstrated an acute distal fibular fracture.  There was no other injury or pain complaints.  He was neurovascularly intact and would weight-bear, albeit with discomfort.  He was not requesting any pain medications at that time and felt as though his symptoms were well managed with Tylenol and ibuprofen.  Patient's compartments were soft and this evaluator had low suspicion for any other emergent pathology.  After discussion with Dr. Jodi Mourning, patient was given a cam walker and crutches and encouraged to follow-up with local orthopedist, Dr. Romeo Apple.  On new examination, patient is stating that his pain symptoms and inflammation is worsened.  He states that he is already reached out to Dr. Romeo Apple and is in the process of scheduling appointment for ongoing evaluation and management.  He tells me that he was carrying his discharge papers when he stepped down on the cam walker and heard a crunch.  He suspects that this may be attributed to his discharge papers that he was holding in his hand, but given his progressively worsening pain symptoms is concerned.  He also admits that he did adjust his cam walker in an attempt to make fit better shortly after he was discharged from the ER.  Patient tells me that he only has taken Tylenol and does not think he actually has NSAIDs at home.  He is requesting a prescription for NSAIDs.  He tells me that he does not want any narcotic pain medication as he has friends who have become addicted.  HPI     Past Medical History:  Diagnosis Date  . ADHD (attention  deficit hyperactivity disorder)    On Concerta /Vyavnse in past  . Asthma    Childhood- no inhaler since elementary age  . Hyperlipidemia     Patient Active Problem List   Diagnosis Date Noted  . Tobacco use disorder 10/01/2015  . Depression 07/02/2015  . Hypertriglyceridemia 06/08/2014  . Routine infant or child health check 03/06/2014  . Obesity 03/06/2014  . Flat foot 03/06/2014    Past Surgical History:  Procedure Laterality Date  . TONSILLECTOMY         Family History  Problem Relation Age of Onset  . Depression Mother   . Hyperlipidemia Father   . Hypertension Father   . Hyperlipidemia Maternal Grandmother   . Cancer Paternal Grandmother   . Hypertension Paternal Grandmother   . COPD Paternal Grandfather   . Depression Paternal Grandfather   . Heart disease Paternal Grandfather   . Hypertension Paternal Grandfather   . Mental illness Paternal Grandfather     Social History   Tobacco Use  . Smoking status: Current Every Day Smoker    Packs/day: 0.25    Types: E-cigarettes  . Smokeless tobacco: Never Used  Vaping Use  . Vaping Use: Never used  Substance Use Topics  . Alcohol use: No    Alcohol/week: 0.0 standard drinks  . Drug use: No    Frequency: 3.0 times per week    Home Medications Prior to Admission medications   Not on File  Allergies    Amoxicillin and Amoxicillin-pot clavulanate  Review of Systems   Review of Systems  All other systems reviewed and are negative.   Physical Exam Updated Vital Signs BP 138/80 (BP Location: Right Arm)   Pulse (!) 59   Temp 97.6 F (36.4 C) (Oral)   Resp 20   Ht 5\' 7"  (1.702 m)   Wt 99.8 kg   SpO2 97%   BMI 34.46 kg/m   Physical Exam Vitals and nursing note reviewed. Exam conducted with a chaperone present.  Constitutional:      Appearance: Normal appearance.  HENT:     Head: Normocephalic and atraumatic.  Eyes:     General: No scleral icterus.    Conjunctiva/sclera: Conjunctivae  normal.  Pulmonary:     Effort: Pulmonary effort is normal.  Musculoskeletal:     Comments: Left foot/ankle: No significant changes when compared to physical exam earlier today.  Compartments remain soft.  Pedal pulse remains intact and symmetric with contralateral foot.  Persistent developing ecchymoses.  No tenting of fibular fracture.  Can wiggle toes.  Capillary refill less than 2 seconds.  Sensation grossly intact.  Tenderness over distal fibula.  Skin:    General: Skin is dry.  Neurological:     Mental Status: He is alert.     GCS: GCS eye subscore is 4. GCS verbal subscore is 5. GCS motor subscore is 6.  Psychiatric:        Mood and Affect: Mood normal.        Behavior: Behavior normal.        Thought Content: Thought content normal.      ED Results / Procedures / Treatments   Labs (all labs ordered are listed, but only abnormal results are displayed) Labs Reviewed - No data to display  EKG None  Radiology DG Ankle Complete Right  Result Date: 09/20/2020 CLINICAL DATA:  Right foot and ankle pain, swelling and bruising since a slip and fall yesterday playing paint ball. Initial encounter. EXAM: RIGHT ANKLE - COMPLETE 3+ VIEW COMPARISON:  None. FINDINGS: The patient has an acute fracture of the distal diaphysis of the fibula with a butterfly fragment posteriorly. The fracture is minimally displaced. Soft tissues are swollen IMPRESSION: Acute fracture of the distal diaphysis of the right fibula with soft tissue swelling as described. Electronically Signed   By: 09/22/2020 M.D.   On: 09/20/2020 11:53   DG Foot Complete Right  Result Date: 09/20/2020 CLINICAL DATA:  Right foot and ankle pain, swelling and bruising since a slip and fall yesterday playing paint ball. Initial encounter. EXAM: RIGHT FOOT COMPLETE - 3+ VIEW COMPARISON:  None. FINDINGS: Acute distal fibular fracture is partially visualized. There is no fracture or dislocation of the foot. There is no evidence of  arthropathy or other focal bone abnormality. Soft tissues of the foot are swollen. IMPRESSION: Acute distal fibular fracture.  Negative for foot fracture. Soft tissue swelling about the foot. Electronically Signed   By: 09/22/2020 M.D.   On: 09/20/2020 11:51    Procedures Procedures (including critical care time)  Medications Ordered in ED Medications  ketorolac (TORADOL) injection 60 mg (60 mg Intramuscular Given 09/20/20 1651)    ED Course  I have reviewed the triage vital signs and the nursing notes.  Pertinent labs & imaging results that were available during my care of the patient were reviewed by me and considered in my medical decision making (see chart for details).    MDM  Rules/Calculators/A&P                          Patient unfortunately had to return to the ED with known acute fibular fracture.  Complained of worsening pain symptoms and given precautions for possibly developing compartment syndrome, patient returned for evaluation.  He also states that his pain felt worse and was uncontrolled with Tylenol.  However, he stating that he does not want any narcotic pain medication as he is afraid of the addictive potential.  He did not have any ibuprofen at home.  He also admits that he had adjusted his cam walker, but also does not like the way that it feels.  Patient informs me that he has an appointment scheduled with Dr. Romeo Apple for 9:45 AM on Thursday, 09/23/2020.  He declines a wrap for his fibular fracture as he feels as though he would cause the same discomfort that the cam walker and forth.  Instead, he would prefer no splint/wrap and instead simply use crutches.  I feel as though that that is reasonable.  Patient states that he does not plan on leaving the house over the next few days and will take oral ketorolac and acetaminophen.  Toradol IM administered here in the ED for pain and swelling.    ED return precautions re-discussed.  He understands and is agreeable.   Grandmother is driving him.    Final Clinical Impression(s) / ED Diagnoses Final diagnoses:  Closed nondisplaced segmental fracture of shaft of right fibula with routine healing, subsequent encounter    Rx / DC Orders ED Discharge Orders    None       Lorelee New, PA-C 09/20/20 1712    Lorelee New, PA-C 09/20/20 1714    Pricilla Loveless, MD 09/21/20 [phone]

## 2020-09-20 NOTE — ED Notes (Signed)
X Ray at bedside at this time.  

## 2020-09-20 NOTE — ED Notes (Signed)
ED Provider at bedside. 

## 2020-09-23 ENCOUNTER — Encounter: Payer: Self-pay | Admitting: Orthopedic Surgery

## 2020-09-23 ENCOUNTER — Ambulatory Visit: Payer: Managed Care, Other (non HMO)

## 2020-09-23 ENCOUNTER — Ambulatory Visit (INDEPENDENT_AMBULATORY_CARE_PROVIDER_SITE_OTHER): Payer: Managed Care, Other (non HMO) | Admitting: Orthopedic Surgery

## 2020-09-23 ENCOUNTER — Telehealth: Payer: Self-pay | Admitting: Radiology

## 2020-09-23 ENCOUNTER — Other Ambulatory Visit: Payer: Self-pay

## 2020-09-23 VITALS — BP 152/98 | HR 90 | Ht 67.0 in | Wt 220.0 lb

## 2020-09-23 DIAGNOSIS — S82891A Other fracture of right lower leg, initial encounter for closed fracture: Secondary | ICD-10-CM

## 2020-09-23 DIAGNOSIS — S8261XA Displaced fracture of lateral malleolus of right fibula, initial encounter for closed fracture: Secondary | ICD-10-CM

## 2020-09-23 NOTE — Patient Instructions (Addendum)
OOW 12 WEEKS    Ankle Fracture  The ankle joint is made up of the lower (distal) sections of your lower leg bones(tibia and fibula) along with a bone in your foot (talus). An ankle fracture is a break in one, two, or all three of these sections of bone. Follow these instructions at home: If you have a splint:  Wear the splint as told by your doctor. Take it off only as told by your doctor.  Loosen the splint if your toes tingle, become numb, or turn cold and blue.  Keep the splint clean.  If the splint is not waterproof: ? Do not let it get wet. ? Cover it with a watertight covering when you take a bath or a shower. If you have a cast:  Do not stick anything inside the cast to scratch your skin. Doing that increases your risk of infection.  Check the skin around the cast every day. Tell your doctor about any concerns.  You may put lotion on dry skin around the edges of the cast. Do not put lotion on the skin underneath the cast.  Keep the cast clean.  If the cast is not waterproof: ? Do not let it get wet. ? Cover it with a watertight covering when you take a bath or a shower. Managing pain, stiffness, and swelling  If directed, put ice on the injured area: ? If you have a removable splint, remove it as told by your doctor. ? Put ice in a plastic bag. ? Place a towel between your skin and the bag. ? Leave the ice on for 20 minutes, 2-3 times a day.  Move your toes often. This prevents stiffness and lessens swelling.  Raise (elevate) the injured area above the level of your heart while you are sitting or lying down. General instructions  Do not use the injured limb to support your body weight until your doctor says that you can. Use crutches as told by your doctor.  Take over-the-counter and prescription medicines only as told by your doctor.  Ask your doctor when it is safe to drive if you have a cast or splint.  Do exercises as told by your doctor.  Do not use any  products that contain nicotine or tobacco, such as cigarettes and e-cigarettes. These can delay bone healing. If you need help quitting, ask your doctor.  Keep all follow-up visits as told by your doctor. This is important. Contact a doctor if:  Your pain or swelling gets worse.  Your pain or swelling does not get better when you rest or take medicine. Get help right away if:  Your cast gets damaged.  You continue to have very bad pain.  You have new pain or swelling.  Your skin or toes below the injured ankle: ? Turn blue or [place]. ? Feel cold or numb. ? Lose sensitivity to touch. Summary  An ankle fracture is a break in one, two, or all three of the bones in your lower leg and lower foot.  If you have a splint, wear it as told by your health care provider. Keep it clean and dry.  If you have a cast, do not stick anything inside the cast to scratch your skin. This can cause infection.  Use ice, take medicines, raise your foot, and avoid tobacco and nicotine products. These steps will lessen pain and swelling and speed up healing. This information is not intended to replace advice given to you by your health  care provider. Make sure you discuss any questions you have with your health care provider. Document Revised: 11/02/2017 Document Reviewed: 12/25/2016 Elsevier Patient Education  2020 [employer].

## 2020-09-23 NOTE — H&P (Signed)
09/23/2020  Body mass index is 34.46 kg/m.  MEDICAL DECISION SECTION:  Encounter Diagnoses  Name Primary?  . Closed fracture of right ankle, initial encounter Yes  . Displaced fracture of lateral malleolus of right fibula, initial encounter for closed fracture     Imaging Initial images at the hospital show a Weber C fracture but the ankle mortise view is over rotated  I repeated the x-rays and the medial clear space is open patient will need surgery  Plan:  (Rx., Inj., surg., Frx, MRI/CT, XR:2)  The procedure has been fully reviewed with the patient; The risks and benefits of surgery have been discussed and explained and understood. Alternative treatment has also been reviewed, questions were encouraged and answered. The postoperative plan is also been reviewed.  ORIF right ankle with syndesmotic fixation  HISTORY SECTION :  Chief Complaint  Patient presents with  . Ankle Pain    right    HPI The patient presents for evaluation of moderately severe right ankle pain for 3 days associated with swelling and pain with weightbearing  He went to the ER twice was immobilized and sent here for follow-up.  Employed as a Writer at [employer]  Review of Systems  All other systems reviewed and are negative.    has a past medical history of ADHD (attention deficit hyperactivity disorder), Asthma, and Hyperlipidemia.   Past Surgical History:  Procedure Laterality Date  . TONSILLECTOMY      Social History   Tobacco Use  . Smoking status: Current Every Day Smoker    Packs/day: 0.25    Types: E-cigarettes  . Smokeless tobacco: Never Used  Vaping Use  . Vaping Use: Never used  Substance Use Topics  . Alcohol use: No    Alcohol/week: 0.0 standard drinks  . Drug use: No    Frequency: 3.0 times per week   Family History  Problem Relation Age of Onset  . Depression Mother   . Hyperlipidemia Father   . Hypertension Father   . Hyperlipidemia Maternal  Grandmother   . Cancer Paternal Grandmother   . Hypertension Paternal Grandmother   . COPD Paternal Grandfather   . Depression Paternal Grandfather   . Heart disease Paternal Grandfather   . Hypertension Paternal Grandfather   . Mental illness Paternal Grandfather       Allergies  Allergen Reactions  . Amoxicillin Hives  . Amoxicillin-Pot Clavulanate Hives     Current Outpatient Medications:  .  ketorolac (TORADOL) 10 MG tablet, Take 1 tablet (10 mg total) by mouth every 6 (six) hours as needed., Disp: 20 tablet, Rfl: 0   PHYSICAL EXAM SECTION: BP (!) 152/98   Pulse 90   Ht 5\' 7"  (1.702 m)   Wt 220 lb (99.8 kg)   BMI 34.46 kg/m   Body mass index is 34.46 kg/m.   General appearance: Well-developed well-nourished no gross deformities  Eyes clear normal vision no evidence of conjunctivitis or jaundice, extraocular muscles intact  Lymph nodes: No lymphadenopathy  Neck is supple without palpable mass, full range of motion  Cardiovascular normal pulse and perfusion the lower extremities without edema in the left lower extremity  Neurologically  no sensation loss or deficits no pathologic reflexes  Psychological: Awake alert and oriented x3 mood and affect normal  Skin no lacerations or ulcerations no nodularity no palpable masses, no erythema or nodularity  Musculoskeletal:   Right ankle.  Tenderness over the medial ankle deltoid ligament and syndesmosis as well as  the fibula Moderate swelling no blisters  Normal sensation in the foot normal capillary refill and color    10:00 AM

## 2020-09-23 NOTE — Patient Instructions (Signed)
Jose Vincent  09/23/2020     @   Your procedure is scheduled on  09/28/2020.  Report to [facility] at  [phone]  A.M.  Call this number if you have problems the morning of surgery:  [phone]   Remember:  Do not eat or drink after midnight.                          Take these medicines the morning of surgery with A SIP OF WATER toradol( if needed).    Do not wear jewelry, make-up or nail polish.  Do not wear lotions, powders, or perfumes. Please wear deodorant and brush your teeth.  Do not shave 48 hours prior to surgery.  Men may shave face and neck.  Do not bring valuables to the hospital.  [facility] is not responsible for any belongings or valuables.  Contacts, dentures or bridgework may not be worn into surgery.  Leave your suitcase in the car.  After surgery it may be brought to your room.  For patients admitted to the hospital, discharge time will be determined by your treatment team.  Patients discharged the day of surgery will not be allowed to drive home.   Name and phone number of your driver:   family Special instructions:   DO NOT smoke the morning of your procedure.  Please read over the following fact sheets that you were given. Anesthesia Post-op Instructions and Care and Recovery After Surgery       Displaced Bimalleolar Ankle Fracture Treated With ORIF A bimalleolar fracture is two breaks (fractures) in the lower bones of the leg that help to form the ankle. These fractures are in:  The bottom end of the bone that you feel as the bump on the outer side of your ankle (fibula).  The bottom end of the bone that you feel as the bump on the inner side of your ankle (tibia). Open reduction with internal fixation (ORIF) is a surgical procedure that may be used to treat a bimalleolar fracture. You may need this surgery if your fracture is displaced, which means that the bones are not lined up correctly. During ORIF, a surgeon  will move the bones back into the right position. The surgeon will put in a combination of screws, metal plates, or different types of wiring to hold the bones in place. Tell a health care provider about:  Any allergies you have.  All medicines you are taking, including vitamins, herbs, eye drops, creams, and over-the-counter medicines.  Any problems you or family members have had with anesthetic medicines.  Any blood disorders you have.  Any surgeries you have had.  Any medical conditions you have.  Whether you are pregnant or may be pregnant. What are the risks? Generally, this is a safe procedure. However, problems may occur, including:  Excessive bleeding.  Infection.  Allergic reactions to medicines.  Damage to nerves or blood vessels.  Failure of the fracture to heal.  Long-term pain and stiffness (arthritis).  Stiffness in the ankle after the repair. What happens before the procedure? Staying hydrated Follow instructions from your health care provider about hydration, which may include:  Up to 2 hours before the procedure - you may continue to drink clear liquids, such as water, clear fruit juice, black coffee, and plain tea. Eating and drinking restrictions Follow instructions from your health care provider about eating and drinking, which  may include:  8 hours before the procedure - stop eating heavy meals or foods such as meat, fried foods, or fatty foods.  6 hours before the procedure - stop eating light meals or foods, such as toast or cereal.  6 hours before the procedure - stop drinking milk or drinks that contain milk.  2 hours before the procedure - stop drinking clear liquids. Medicines  Ask your health care provider about: ? Changing or stopping your regular medicines. This is especially important if you are taking diabetes medicines or blood thinners. ? Taking medicines such as aspirin and ibuprofen. These medicines can thin your blood. Do not  take these medicines unless your health care provider tells you to take them. ? Taking over-the-counter medicines, vitamins, herbs, and supplements.  You may be given antibiotic medicine to help prevent infection. General instructions  Plan to have someone take you home from the hospital or clinic.  Plan to have a responsible adult care for you for at least 24 hours after you leave the hospital or clinic. This is important.  Ask your health care provider how your surgical site will be marked or identified.  You may be asked to shower with a germ-killing soap. What happens during the procedure?  To lower your risk of infection: ? Your health care team will wash or sanitize their hands. ? Hair may be removed from the surgical area. ? Your skin will be washed with soap.  An IV will be inserted into one of your veins. You may be given antibiotic medicine and medicine for pain through the IV.  You will be given one or more of the following: ? A medicine to numb the area (local anesthetic). ? A medicine to make you fall asleep (general anesthetic). ? A medicine that is injected into your spine to numb the area below and slightly above the injection site (spinal anesthetic). ? A medicine that is injected into an area of your body to numb everything below the injection site (regional anesthetic).  The surgeon will make an incision through your skin over the area of the fractures.  The broken bones will be put back into their normal positions. The surgeon will use a combination of screws, metal plates, or different types of wiring to hold the bones in place.  After the bones are back in place, the surgeon will close the incision using stitches (sutures) or staples.  A bandage (dressing) and a splint, cast, or supportive boot will be placed over your ankle. The procedure may vary among health care providers and hospitals. What happens after the procedure?  Your blood pressure, heart rate,  breathing rate, and blood oxygen level will be monitored until the medicines you were given have worn off.  You will be given medicine for pain as needed.  You may be given instructions about how much body weight you can or cannot safely support (bear) on your injured foot (weight-bearing restrictions). You may be given crutches, a cane, or a walker to help you move around so that you do not bear any weight on your foot.  While in the hospital, you will be helped out of bed so you can begin moving around. This is important to improve blood flow and breathing.  Do not drive for 24 hours if you were given a medicine to help you relax (sedative) during your procedure. Summary  A bimalleolar fracture is two breaks (fractures) in the lower bones of the leg (fibula and tibia) that  help to form the ankle.  To repair the fractures with ORIF surgery, the surgeon will make an incision over the bones and move them back into place.  A combination of screws, metal plates, or wires will be used to permanently hold the bones in place.  Plan to have someone take you home after the procedure. Also, plan to have a responsible adult care for you for at least 24 hours after you leave the hospital or clinic. This information is not intended to replace advice given to you by your health care provider. Make sure you discuss any questions you have with your health care provider. Document Revised: 11/02/2017 Document Reviewed: 08/31/2017 Elsevier Patient Education  2020 [employer]  General Anesthesia, Adult, Care After This sheet gives you information about how to care for yourself after your procedure. Your health care provider may also give you more specific instructions. If you have problems or questions, contact your health care provider. What can I expect after the procedure? After the procedure, the following side effects are common:  Pain or discomfort at the IV site.  Nausea.  Vomiting.  Sore  throat.  Trouble concentrating.  Feeling cold or chills.  Weak or tired.  Sleepiness and fatigue.  Soreness and body aches. These side effects can affect parts of the body that were not involved in surgery. Follow these instructions at home:  For at least 24 hours after the procedure:  Have a responsible adult stay with you. It is important to have someone help care for you until you are awake and alert.  Rest as needed.  Do not: ? Participate in activities in which you could fall or become injured. ? Drive. ? Use heavy machinery. ? Drink alcohol. ? Take sleeping pills or medicines that cause drowsiness. ? Make important decisions or sign legal documents. ? Take care of children on your own. Eating and drinking  Follow any instructions from your health care provider about eating or drinking restrictions.  When you feel hungry, start by eating small amounts of foods that are soft and easy to digest (bland), such as toast. Gradually return to your regular diet.  Drink enough fluid to keep your urine pale yellow.  If you vomit, rehydrate by drinking water, juice, or clear broth. General instructions  If you have sleep apnea, surgery and certain medicines can increase your risk for breathing problems. Follow instructions from your health care provider about wearing your sleep device: ? Anytime you are sleeping, including during daytime naps. ? While taking prescription pain medicines, sleeping medicines, or medicines that make you drowsy.  Return to your normal activities as told by your health care provider. Ask your health care provider what activities are safe for you.  Take over-the-counter and prescription medicines only as told by your health care provider.  If you smoke, do not smoke without supervision.  Keep all follow-up visits as told by your health care provider. This is important. Contact a health care provider if:  You have nausea or vomiting that does not  get better with medicine.  You cannot eat or drink without vomiting.  You have pain that does not get better with medicine.  You are unable to pass urine.  You develop a skin rash.  You have a fever.  You have redness around your IV site that gets worse. Get help right away if:  You have difficulty breathing.  You have chest pain.  You have blood in your urine or stool, or  you vomit blood. Summary  After the procedure, it is common to have a sore throat or nausea. It is also common to feel tired.  Have a responsible adult stay with you for the first 24 hours after general anesthesia. It is important to have someone help care for you until you are awake and alert.  When you feel hungry, start by eating small amounts of foods that are soft and easy to digest (bland), such as toast. Gradually return to your regular diet.  Drink enough fluid to keep your urine pale yellow.  Return to your normal activities as told by your health care provider. Ask your health care provider what activities are safe for you. This information is not intended to replace advice given to you by your health care provider. Make sure you discuss any questions you have with your health care provider. Document Revised: 11/23/2017 Document Reviewed: 07/06/2017 Elsevier Patient Education  2020 [employer]. How to Use Chlorhexidine for Bathing Chlorhexidine gluconate (CHG) is a germ-killing (antiseptic) solution that is used to clean the skin. It can get rid of the bacteria that normally live on the skin and can keep them away for about 24 hours. To clean your skin with CHG, you may be given:  A CHG solution to use in the shower or as part of a sponge bath.  A prepackaged cloth that contains CHG. Cleaning your skin with CHG may help lower the risk for infection:  While you are staying in the intensive care unit of the hospital.  If you have a vascular access, such as a central line, to provide short-term  or long-term access to your veins.  If you have a catheter to drain urine from your bladder.  If you are on a ventilator. A ventilator is a machine that helps you breathe by moving air in and out of your lungs.  After surgery. What are the risks? Risks of using CHG include:  A skin reaction.  Hearing loss, if CHG gets in your ears.  Eye injury, if CHG gets in your eyes and is not rinsed out.  The CHG product catching fire. Make sure that you avoid smoking and flames after applying CHG to your skin. Do not use CHG:  If you have a chlorhexidine allergy or have previously reacted to chlorhexidine.  On babies younger than 16 months of age. How to use CHG solution  Use CHG only as told by your health care provider, and follow the instructions on the label.  Use the full amount of CHG as directed. Usually, this is one bottle. During a shower Follow these steps when using CHG solution during a shower (unless your health care provider gives you different instructions): 1. Start the shower. 2. Use your normal soap and shampoo to wash your face and hair. 3. Turn off the shower or move out of the shower stream. 4. Pour the CHG onto a clean washcloth. Do not use any type of brush or rough-edged sponge. 5. Starting at your neck, lather your body down to your toes. Make sure you follow these instructions: ? If you will be having surgery, pay special attention to the part of your body where you will be having surgery. Scrub this area for at least 1 minute. ? Do not use CHG on your head or face. If the solution gets into your ears or eyes, rinse them well with water. ? Avoid your genital area. ? Avoid any areas of skin that have broken skin,  cuts, or scrapes. ? Scrub your back and under your arms. Make sure to wash skin folds. 6. Let the lather sit on your skin for 1-2 minutes or as long as told by your health care provider. 7. Thoroughly rinse your entire body in the shower. Make sure that  all body creases and crevices are rinsed well. 8. Dry off with a clean towel. Do not put any substances on your body afterward--such as powder, lotion, or perfume--unless you are told to do so by your health care provider. Only use lotions that are recommended by the manufacturer. 9. Put on clean clothes or pajamas. 10. If it is the night before your surgery, sleep in clean sheets.  During a sponge bath Follow these steps when using CHG solution during a sponge bath (unless your health care provider gives you different instructions): 1. Use your normal soap and shampoo to wash your face and hair. 2. Pour the CHG onto a clean washcloth. 3. Starting at your neck, lather your body down to your toes. Make sure you follow these instructions: ? If you will be having surgery, pay special attention to the part of your body where you will be having surgery. Scrub this area for at least 1 minute. ? Do not use CHG on your head or face. If the solution gets into your ears or eyes, rinse them well with water. ? Avoid your genital area. ? Avoid any areas of skin that have broken skin, cuts, or scrapes. ? Scrub your back and under your arms. Make sure to wash skin folds. 4. Let the lather sit on your skin for 1-2 minutes or as long as told by your health care provider. 5. Using a different clean, wet washcloth, thoroughly rinse your entire body. Make sure that all body creases and crevices are rinsed well. 6. Dry off with a clean towel. Do not put any substances on your body afterward--such as powder, lotion, or perfume--unless you are told to do so by your health care provider. Only use lotions that are recommended by the manufacturer. 7. Put on clean clothes or pajamas. 8. If it is the night before your surgery, sleep in clean sheets. How to use CHG prepackaged cloths  Only use CHG cloths as told by your health care provider, and follow the instructions on the label.  Use the CHG cloth on clean, dry  skin.  Do not use the CHG cloth on your head or face unless your health care provider tells you to.  When washing with the CHG cloth: ? Avoid your genital area. ? Avoid any areas of skin that have broken skin, cuts, or scrapes. Before surgery Follow these steps when using a CHG cloth to clean before surgery (unless your health care provider gives you different instructions): 1. Using the CHG cloth, vigorously scrub the part of your body where you will be having surgery. Scrub using a back-and-forth motion for 3 minutes. The area on your body should be completely wet with CHG when you are done scrubbing. 2. Do not rinse. Discard the cloth and let the area air-dry. Do not put any substances on the area afterward, such as powder, lotion, or perfume. 3. Put on clean clothes or pajamas. 4. If it is the night before your surgery, sleep in clean sheets.  For general bathing Follow these steps when using CHG cloths for general bathing (unless your health care provider gives you different instructions). 1. Use a separate CHG cloth for each area of  your body. Make sure you wash between any folds of skin and between your fingers and toes. Wash your body in the following order, switching to a new cloth after each step: ? The front of your neck, shoulders, and chest. ? Both of your arms, under your arms, and your hands. ? Your stomach and groin area, avoiding the genitals. ? Your right leg and foot. ? Your left leg and foot. ? The back of your neck, your back, and your buttocks. 2. Do not rinse. Discard the cloth and let the area air-dry. Do not put any substances on your body afterward--such as powder, lotion, or perfume--unless you are told to do so by your health care provider. Only use lotions that are recommended by the manufacturer. 3. Put on clean clothes or pajamas. Contact a health care provider if:  Your skin gets irritated after scrubbing.  You have questions about using your solution or  cloth. Get help right away if:  Your eyes become very red or swollen.  Your eyes itch badly.  Your skin itches badly and is red or swollen.  Your hearing changes.  You have trouble seeing.  You have swelling or tingling in your mouth or throat.  You have trouble breathing.  You swallow any chlorhexidine. Summary  Chlorhexidine gluconate (CHG) is a germ-killing (antiseptic) solution that is used to clean the skin. Cleaning your skin with CHG may help to lower your risk for infection.  You may be given CHG to use for bathing. It may be in a bottle or in a prepackaged cloth to use on your skin. Carefully follow your health care provider's instructions and the instructions on the product label.  Do not use CHG if you have a chlorhexidine allergy.  Contact your health care provider if your skin gets irritated after scrubbing. This information is not intended to replace advice given to you by your health care provider. Make sure you discuss any questions you have with your health care provider. Document Revised: 02/06/2019 Document Reviewed: 10/18/2017 Elsevier Patient Education  2020 [employer].

## 2020-09-23 NOTE — Addendum Note (Signed)
Addended byCaffie Damme on: 09/23/2020 10:29 AM   Modules accepted: Orders, SmartSet

## 2020-09-23 NOTE — Telephone Encounter (Signed)
Jose Vincent and CPT codes 38177 [phone] do not need authorization per their auto bot phone system confirmation numbers are [phone] and [phone]

## 2020-09-23 NOTE — Progress Notes (Addendum)
Jose Vincent  09/23/2020  Body mass index is 34.46 kg/m.  MEDICAL DECISION SECTION:  Encounter Diagnoses  Name Primary?  . Closed fracture of right ankle, initial encounter Yes  . Displaced fracture of lateral malleolus of right fibula, initial encounter for closed fracture     Imaging Initial images at the hospital show a Weber C fracture but the ankle mortise view is over rotated  I repeated the x-rays and the medial clear space is open patient will need surgery  Plan:  (Rx., Inj., surg., Frx, MRI/CT, XR:2)  The procedure has been fully reviewed with the patient; The risks and benefits of surgery have been discussed and explained and understood. Alternative treatment has also been reviewed, questions were encouraged and answered. The postoperative plan is also been reviewed.  ORIF right ankle with syndesmotic fixation  HISTORY SECTION :  Chief Complaint  Patient presents with  . Ankle Pain    right    HPI The patient presents for evaluation of moderately severe right ankle pain for 3 days associated with swelling and pain with weightbearing  He went to the ER twice was immobilized and sent here for follow-up.  Employed as a Writer at [employer]  Review of Systems  All other systems reviewed and are negative.    has a past medical history of ADHD (attention deficit hyperactivity disorder), Asthma, and Hyperlipidemia.   Past Surgical History:  Procedure Laterality Date  . TONSILLECTOMY      Social History   Tobacco Use  . Smoking status: Current Every Day Smoker    Packs/day: 0.25    Types: E-cigarettes  . Smokeless tobacco: Never Used  Vaping Use  . Vaping Use: Never used  Substance Use Topics  . Alcohol use: No    Alcohol/week: 0.0 standard drinks  . Drug use: No    Frequency: 3.0 times per week   Family History  Problem Relation Age of Onset  . Depression Mother   . Hyperlipidemia Father   . Hypertension Father   .  Hyperlipidemia Maternal Grandmother   . Cancer Paternal Grandmother   . Hypertension Paternal Grandmother   . COPD Paternal Grandfather   . Depression Paternal Grandfather   . Heart disease Paternal Grandfather   . Hypertension Paternal Grandfather   . Mental illness Paternal Grandfather       Allergies  Allergen Reactions  . Amoxicillin Hives  . Amoxicillin-Pot Clavulanate Hives     Current Outpatient Medications:  .  ketorolac (TORADOL) 10 MG tablet, Take 1 tablet (10 mg total) by mouth every 6 (six) hours as needed., Disp: 20 tablet, Rfl: 0   PHYSICAL EXAM SECTION: BP (!) 152/98   Pulse 90   Ht 5\' 7"  (1.702 m)   Wt 220 lb (99.8 kg)   BMI 34.46 kg/m   Body mass index is 34.46 kg/m.   General appearance: Well-developed well-nourished no gross deformities  Eyes clear normal vision no evidence of conjunctivitis or jaundice, extraocular muscles intact  Lymph nodes: No lymphadenopathy  Neck is supple without palpable mass, full range of motion  Cardiovascular normal pulse and perfusion the lower extremities without edema in the left lower extremity  Neurologically  no sensation loss or deficits no pathologic reflexes  Psychological: Awake alert and oriented x3 mood and affect normal  Skin no lacerations or ulcerations no nodularity no palpable masses, no erythema or nodularity  Musculoskeletal:   Right ankle.  Tenderness over the medial ankle deltoid ligament and syndesmosis  as well as the fibula Moderate swelling no blisters  Normal sensation in the foot normal capillary refill and color    10:00 AM

## 2020-09-24 ENCOUNTER — Encounter (HOSPITAL_COMMUNITY): Payer: Self-pay

## 2020-09-24 ENCOUNTER — Other Ambulatory Visit (HOSPITAL_COMMUNITY)
Admission: RE | Admit: 2020-09-24 | Discharge: 2020-09-24 | Disposition: A | Discharge status: Home / Self Care | Payer: Managed Care, Other (non HMO) | Source: Ambulatory Visit | Attending: Orthopedic Surgery | Admitting: Orthopedic Surgery

## 2020-09-24 ENCOUNTER — Encounter (HOSPITAL_COMMUNITY)
Admission: RE | Admit: 2020-09-24 | Discharge: 2020-09-24 | Disposition: A | Discharge status: Home / Self Care | Payer: Managed Care, Other (non HMO) | Source: Ambulatory Visit | Attending: Orthopedic Surgery | Admitting: Orthopedic Surgery

## 2020-09-24 DIAGNOSIS — Z01812 Encounter for preprocedural laboratory examination: Secondary | ICD-10-CM | POA: Insufficient documentation

## 2020-09-24 DIAGNOSIS — Z20822 Contact with and (suspected) exposure to covid-19: Secondary | ICD-10-CM | POA: Insufficient documentation

## 2020-09-24 LAB — CBC WITH DIFFERENTIAL/PLATELET
Abs Immature Granulocytes: 0.02 10*3/uL (ref 0.00–0.07)
Basophils Absolute: 0.1 10*3/uL (ref 0.0–0.1)
Basophils Relative: 1 %
Eosinophils Absolute: 0.2 10*3/uL (ref 0.0–0.5)
Eosinophils Relative: 3 %
HCT: 43.8 % (ref 39.0–52.0)
Hemoglobin: 15.6 g/dL (ref 13.0–17.0)
Immature Granulocytes: 0 %
Lymphocytes Relative: 30 %
Lymphs Abs: 2.2 10*3/uL (ref 0.7–4.0)
MCH: 31.5 pg (ref 26.0–34.0)
MCHC: 35.6 g/dL (ref 30.0–36.0)
MCV: 88.3 fL (ref 80.0–100.0)
Monocytes Absolute: 0.7 10*3/uL (ref 0.1–1.0)
Monocytes Relative: 10 %
Neutro Abs: 4 10*3/uL (ref 1.7–7.7)
Neutrophils Relative %: 56 %
Platelets: 282 10*3/uL (ref 150–400)
RBC: 4.96 MIL/uL (ref 4.22–5.81)
RDW: 11.8 % (ref 11.5–15.5)
WBC: 7.2 10*3/uL (ref 4.0–10.5)
nRBC: 0 % (ref 0.0–0.2)

## 2020-09-24 LAB — BASIC METABOLIC PANEL
Anion gap: 10 (ref 5–15)
BUN: 17 mg/dL (ref 6–20)
CO2: 25 mmol/L (ref 22–32)
Calcium: 9.1 mg/dL (ref 8.9–10.3)
Chloride: 103 mmol/L (ref 98–111)
Creatinine, Ser: 0.65 mg/dL (ref 0.61–1.24)
GFR, Estimated: >60 mL/min (ref >60)
Glucose, Bld: 83 mg/dL (ref 70–99)
Potassium: 3.5 mmol/L (ref 3.5–5.1)
Sodium: 138 mmol/L (ref 135–145)

## 2020-09-25 LAB — SARS CORONAVIRUS 2 (TAT 6-24 HRS): SARS Coronavirus 2: NEGATIVE

## 2020-09-28 ENCOUNTER — Ambulatory Visit (HOSPITAL_COMMUNITY)
Admission: RE | Admit: 2020-09-28 | Discharge: 2020-09-28 | Disposition: A | Discharge status: Home / Self Care | Payer: Managed Care, Other (non HMO) | Attending: Orthopedic Surgery | Admitting: Orthopedic Surgery

## 2020-09-28 ENCOUNTER — Ambulatory Visit (HOSPITAL_COMMUNITY): Payer: Managed Care, Other (non HMO) | Admitting: Anesthesiology

## 2020-09-28 ENCOUNTER — Encounter (HOSPITAL_COMMUNITY)
Admission: RE | Disposition: A | Discharge status: Home / Self Care | Payer: Self-pay | Source: Home / Self Care | Attending: Orthopedic Surgery

## 2020-09-28 ENCOUNTER — Other Ambulatory Visit: Payer: Self-pay

## 2020-09-28 ENCOUNTER — Ambulatory Visit (HOSPITAL_COMMUNITY): Payer: Managed Care, Other (non HMO)

## 2020-09-28 DIAGNOSIS — Z8781 Personal history of (healed) traumatic fracture: Secondary | ICD-10-CM

## 2020-09-28 DIAGNOSIS — Z8349 Family history of other endocrine, nutritional and metabolic diseases: Secondary | ICD-10-CM | POA: Diagnosis not present

## 2020-09-28 DIAGNOSIS — S82453D Displaced comminuted fracture of shaft of unspecified fibula, subsequent encounter for closed fracture with routine healing: Secondary | ICD-10-CM

## 2020-09-28 DIAGNOSIS — Z880 Allergy status to penicillin: Secondary | ICD-10-CM | POA: Insufficient documentation

## 2020-09-28 DIAGNOSIS — Z8249 Family history of ischemic heart disease and other diseases of the circulatory system: Secondary | ICD-10-CM | POA: Diagnosis not present

## 2020-09-28 DIAGNOSIS — F909 Attention-deficit hyperactivity disorder, unspecified type: Secondary | ICD-10-CM | POA: Insufficient documentation

## 2020-09-28 DIAGNOSIS — S82451D Displaced comminuted fracture of shaft of right fibula, subsequent encounter for closed fracture with routine healing: Secondary | ICD-10-CM | POA: Diagnosis not present

## 2020-09-28 DIAGNOSIS — S93431A Sprain of tibiofibular ligament of right ankle, initial encounter: Secondary | ICD-10-CM | POA: Diagnosis not present

## 2020-09-28 DIAGNOSIS — Z9889 Other specified postprocedural states: Secondary | ICD-10-CM

## 2020-09-28 DIAGNOSIS — Z791 Long term (current) use of non-steroidal anti-inflammatories (NSAID): Secondary | ICD-10-CM | POA: Insufficient documentation

## 2020-09-28 DIAGNOSIS — Z825 Family history of asthma and other chronic lower respiratory diseases: Secondary | ICD-10-CM | POA: Insufficient documentation

## 2020-09-28 DIAGNOSIS — X58XXXA Exposure to other specified factors, initial encounter: Secondary | ICD-10-CM | POA: Insufficient documentation

## 2020-09-28 DIAGNOSIS — F17290 Nicotine dependence, other tobacco product, uncomplicated: Secondary | ICD-10-CM | POA: Diagnosis not present

## 2020-09-28 DIAGNOSIS — Z818 Family history of other mental and behavioral disorders: Secondary | ICD-10-CM | POA: Diagnosis not present

## 2020-09-28 DIAGNOSIS — S8261XA Displaced fracture of lateral malleolus of right fibula, initial encounter for closed fracture: Secondary | ICD-10-CM | POA: Insufficient documentation

## 2020-09-28 DIAGNOSIS — Z809 Family history of malignant neoplasm, unspecified: Secondary | ICD-10-CM | POA: Insufficient documentation

## 2020-09-28 HISTORY — PX: ORIF ANKLE FRACTURE: SHX5408

## 2020-09-28 SURGERY — OPEN REDUCTION INTERNAL FIXATION (ORIF) ANKLE FRACTURE
Anesthesia: General | Site: Ankle | Laterality: Right

## 2020-09-28 MED ORDER — VANCOMYCIN HCL IN DEXTROSE 1-5 GM/200ML-% IV SOLN: 1000.0000 mg | INTRAVENOUS | Status: DC

## 2020-09-28 MED ORDER — IBUPROFEN 800 MG PO TABS
800.0000 mg | ORAL_TABLET | Freq: Once | ORAL | Status: AC
  Administered 2020-09-28: 800 mg via ORAL
  Filled 2020-09-28: qty 1

## 2020-09-28 MED ORDER — MEPERIDINE HCL 50 MG/ML IJ SOLN: 6.2500 mg | INTRAMUSCULAR | Status: DC | PRN

## 2020-09-28 MED ORDER — PROMETHAZINE HCL 12.5 MG PO TABS: 12.5000 mg | ORAL_TABLET | Freq: Four times a day (QID) | ORAL | 0 refills | Status: DC | PRN

## 2020-09-28 MED ORDER — ONDANSETRON HCL 4 MG/2ML IJ SOLN
INTRAMUSCULAR | Status: AC
  Filled 2020-09-28: qty 2

## 2020-09-28 MED ORDER — FENTANYL CITRATE (PF) 100 MCG/2ML IJ SOLN
INTRAMUSCULAR | Status: DC | PRN
  Administered 2020-09-28 (×5): 50 ug via INTRAVENOUS

## 2020-09-28 MED ORDER — BUPIVACAINE-EPINEPHRINE (PF) 0.5% -1:200000 IJ SOLN
INTRAMUSCULAR | Status: DC | PRN
  Administered 2020-09-28: 60 mL via PERINEURAL

## 2020-09-28 MED ORDER — ONDANSETRON HCL 4 MG/2ML IJ SOLN
4.0000 mg | Freq: Once | INTRAMUSCULAR | Status: AC
  Administered 2020-09-28: 4 mg via INTRAVENOUS
  Filled 2020-09-28: qty 2

## 2020-09-28 MED ORDER — LIDOCAINE 2% (20 MG/ML) 5 ML SYRINGE
INTRAMUSCULAR | Status: DC | PRN
  Administered 2020-09-28: 100 mg via INTRAVENOUS

## 2020-09-28 MED ORDER — VANCOMYCIN HCL IN DEXTROSE 1-5 GM/200ML-% IV SOLN
INTRAVENOUS | Status: AC
  Filled 2020-09-28: qty 200

## 2020-09-28 MED ORDER — LACTATED RINGERS IV SOLN: INTRAVENOUS | Status: DC | PRN

## 2020-09-28 MED ORDER — DEXAMETHASONE SODIUM PHOSPHATE 10 MG/ML IJ SOLN
INTRAMUSCULAR | Status: AC
  Filled 2020-09-28: qty 1

## 2020-09-28 MED ORDER — MIDAZOLAM HCL 2 MG/2ML IJ SOLN: 2.0000 mg | Freq: Once | INTRAMUSCULAR | Status: DC

## 2020-09-28 MED ORDER — PROPOFOL 10 MG/ML IV BOLUS
INTRAVENOUS | Status: AC
  Filled 2020-09-28: qty 20

## 2020-09-28 MED ORDER — KETOROLAC TROMETHAMINE 30 MG/ML IJ SOLN
INTRAMUSCULAR | Status: DC | PRN
  Administered 2020-09-28: 30 mg via INTRAVENOUS

## 2020-09-28 MED ORDER — VANCOMYCIN HCL 1000 MG IV SOLR
INTRAVENOUS | Status: DC | PRN
  Administered 2020-09-28: 1000 mg via INTRAVENOUS

## 2020-09-28 MED ORDER — OXYCODONE-ACETAMINOPHEN 5-325 MG PO TABS: 1.0000 | ORAL_TABLET | ORAL | 0 refills | Status: DC | PRN

## 2020-09-28 MED ORDER — BUPIVACAINE-EPINEPHRINE (PF) 0.5% -1:200000 IJ SOLN
INTRAMUSCULAR | Status: AC
  Filled 2020-09-28: qty 60

## 2020-09-28 MED ORDER — PHENYLEPHRINE 40 MCG/ML (10ML) SYRINGE FOR IV PUSH (FOR BLOOD PRESSURE SUPPORT)
PREFILLED_SYRINGE | INTRAVENOUS | Status: AC
  Filled 2020-09-28: qty 10

## 2020-09-28 MED ORDER — OXYCODONE HCL 5 MG PO TABS
5.0000 mg | ORAL_TABLET | Freq: Once | ORAL | Status: AC
  Administered 2020-09-28: 5 mg via ORAL
  Filled 2020-09-28: qty 1

## 2020-09-28 MED ORDER — GLYCOPYRROLATE PF 0.2 MG/ML IJ SOSY
PREFILLED_SYRINGE | INTRAMUSCULAR | Status: AC
  Filled 2020-09-28: qty 1

## 2020-09-28 MED ORDER — CHLORHEXIDINE GLUCONATE 0.12 % MT SOLN
OROMUCOSAL | Status: AC
  Filled 2020-09-28: qty 15

## 2020-09-28 MED ORDER — HYDROMORPHONE HCL 1 MG/ML IJ SOLN
0.2500 mg | INTRAMUSCULAR | Status: DC | PRN
  Administered 2020-09-28 (×3): 0.5 mg via INTRAVENOUS
  Filled 2020-09-28 (×3): qty 0.5

## 2020-09-28 MED ORDER — MIDAZOLAM HCL 2 MG/2ML IJ SOLN
INTRAMUSCULAR | Status: AC
  Filled 2020-09-28: qty 2

## 2020-09-28 MED ORDER — GLYCOPYRROLATE PF 0.2 MG/ML IJ SOSY
PREFILLED_SYRINGE | INTRAMUSCULAR | Status: DC | PRN
  Administered 2020-09-28: .1 mg via INTRAVENOUS

## 2020-09-28 MED ORDER — ORAL CARE MOUTH RINSE: 15.0000 mL | Freq: Once | OROMUCOSAL | Status: AC

## 2020-09-28 MED ORDER — MIDAZOLAM HCL 5 MG/5ML IJ SOLN
INTRAMUSCULAR | Status: DC | PRN
  Administered 2020-09-28: 2 mg via INTRAVENOUS

## 2020-09-28 MED ORDER — CHLORHEXIDINE GLUCONATE 0.12 % MT SOLN
15.0000 mL | Freq: Once | OROMUCOSAL | Status: AC
  Administered 2020-09-28: 15 mL via OROMUCOSAL

## 2020-09-28 MED ORDER — PROPOFOL 10 MG/ML IV BOLUS
INTRAVENOUS | Status: DC | PRN
  Administered 2020-09-28: 200 mg via INTRAVENOUS

## 2020-09-28 MED ORDER — FENTANYL CITRATE (PF) 250 MCG/5ML IJ SOLN
INTRAMUSCULAR | Status: AC
  Filled 2020-09-28: qty 5

## 2020-09-28 MED ORDER — 0.9 % SODIUM CHLORIDE (POUR BTL) OPTIME
TOPICAL | Status: DC | PRN
  Administered 2020-09-28: 1000 mL

## 2020-09-28 MED ORDER — PROMETHAZINE HCL 25 MG/ML IJ SOLN: 6.2500 mg | INTRAMUSCULAR | Status: DC | PRN

## 2020-09-28 MED ORDER — LIDOCAINE 2% (20 MG/ML) 5 ML SYRINGE
INTRAMUSCULAR | Status: AC
  Filled 2020-09-28: qty 5

## 2020-09-28 MED ORDER — DEXAMETHASONE SODIUM PHOSPHATE 4 MG/ML IJ SOLN
INTRAMUSCULAR | Status: DC | PRN
  Administered 2020-09-28: 6 mg via INTRAVENOUS

## 2020-09-28 MED ORDER — LACTATED RINGERS IV SOLN: Freq: Once | INTRAVENOUS | Status: AC

## 2020-09-28 MED ORDER — ONDANSETRON HCL 4 MG/2ML IJ SOLN
INTRAMUSCULAR | Status: DC | PRN
  Administered 2020-09-28: 4 mg via INTRAVENOUS

## 2020-09-28 MED ORDER — FENTANYL CITRATE (PF) 100 MCG/2ML IJ SOLN: 100.0000 ug | Freq: Once | INTRAMUSCULAR | Status: DC

## 2020-09-28 SURGICAL SUPPLY — 70 items
APL PRP STRL LF DISP 70% ISPRP (MISCELLANEOUS) ×2
BANDAGE ESMARK 4X12 BL STRL LF (DISPOSABLE) ×1 IMPLANT
BIT DRILL 2.5 CANN LNG (BIT) ×2 IMPLANT
BIT DRILL 3.5X122MM AO FIT (BIT) IMPLANT
BIT DRILL CANN 2.7 (BIT)
BIT DRILL SRG 2.7XCANN AO CPLG (BIT) IMPLANT
BIT DRL SRG 2.7XCANN AO CPLNG (BIT)
BLADE SURG SZ10 CARB STEEL (BLADE) ×2 IMPLANT
BNDG CMPR 12X4 ELC STRL LF (DISPOSABLE) ×1
BNDG CMPR STD VLCR NS LF 5.8X4 (GAUZE/BANDAGES/DRESSINGS) ×2
BNDG COHESIVE 4X5 TAN STRL (GAUZE/BANDAGES/DRESSINGS) ×2 IMPLANT
BNDG ELASTIC 4X5.8 VLCR NS LF (GAUZE/BANDAGES/DRESSINGS) ×4 IMPLANT
BNDG ESMARK 4X12 BLUE STRL LF (DISPOSABLE) ×2
CHLORAPREP W/TINT 26 (MISCELLANEOUS) ×4 IMPLANT
CLOTH BEACON ORANGE TIMEOUT ST (SAFETY) ×2 IMPLANT
COVER LIGHT HANDLE STERIS (MISCELLANEOUS) ×4 IMPLANT
COVER WAND RF STERILE (DRAPES) ×2 IMPLANT
CUFF TOURN SGL QUICK 34 (TOURNIQUET CUFF) ×2
CUFF TRNQT CYL 34X4.125X (TOURNIQUET CUFF) ×1 IMPLANT
DECANTER SPIKE VIAL GLASS SM (MISCELLANEOUS) IMPLANT
DRAPE C-ARM FOLDED MOBILE STRL (DRAPES) ×2 IMPLANT
DRILL 2.6X122MM WL AO SHAFT (BIT) IMPLANT
ELECT REM PT RETURN 9FT ADLT (ELECTROSURGICAL) ×2
ELECTRODE REM PT RTRN 9FT ADLT (ELECTROSURGICAL) ×1 IMPLANT
GAUZE SPONGE 4X4 12PLY STRL (GAUZE/BANDAGES/DRESSINGS) ×2 IMPLANT
GAUZE XEROFORM 5X9 LF (GAUZE/BANDAGES/DRESSINGS) ×2 IMPLANT
GLOVE BIOGEL PI IND STRL 7.0 (GLOVE) ×3 IMPLANT
GLOVE BIOGEL PI INDICATOR 7.0 (GLOVE) ×3
GLOVE SKINSENSE NS SZ8.0 LF (GLOVE) ×1
GLOVE SKINSENSE STRL SZ8.0 LF (GLOVE) ×1 IMPLANT
GLOVE SS N UNI LF 8.5 STRL (GLOVE) ×2 IMPLANT
GOWN STRL REUS W/ TWL XL LVL3 (GOWN DISPOSABLE) ×1 IMPLANT
GOWN STRL REUS W/TWL LRG LVL3 (GOWN DISPOSABLE) ×4 IMPLANT
GOWN STRL REUS W/TWL XL LVL3 (GOWN DISPOSABLE) ×4 IMPLANT
INST SET MINOR BONE (KITS) ×2 IMPLANT
K-WIRE 1.6X150 (WIRE) ×4
K-WIRE FX150X1.6XKRSH (WIRE) ×2
K-WIRE ORTHOPEDIC 1.4X150L (WIRE)
K-WIRE SMOOTH 2.0X150 (WIRE)
KIT TURNOVER KIT A (KITS) ×2 IMPLANT
KWIRE FX150X1.6XKRSH (WIRE) ×2 IMPLANT
KWIRE ORTHOPEDIC 1.4X150L (WIRE) IMPLANT
KWIRE SMOOTH 2.0X150 (WIRE) IMPLANT
MANIFOLD NEPTUNE II (INSTRUMENTS) ×2 IMPLANT
NEEDLE HYPO 21X1.5 SAFETY (NEEDLE) ×2 IMPLANT
NS IRRIG 1000ML POUR BTL (IV SOLUTION) ×2 IMPLANT
PACK BASIC LIMB (CUSTOM PROCEDURE TRAY) ×2 IMPLANT
PAD ABD 5X9 TENDERSORB (GAUZE/BANDAGES/DRESSINGS) ×2 IMPLANT
PAD ARMBOARD 7.5X6 YLW CONV (MISCELLANEOUS) ×2 IMPLANT
PAD CAST 4YDX4 CTTN HI CHSV (CAST SUPPLIES) ×2 IMPLANT
PADDING CAST COTTON 4X4 STRL (CAST SUPPLIES) ×4
PLATE 8 HOLE STRAIGHT LOCKING (Plate) ×2 IMPLANT
SCREW LO-PRO TI 3.5X16MM (Screw) ×2 IMPLANT
SCREW LOCK 3.5X14MM (Screw) ×2 IMPLANT
SCREW LP 3.5X12MM (Screw) ×2 IMPLANT
SCREW LP TI 3.5X14MM (Screw) ×12 IMPLANT
SET BASIN LINEN APH (SET/KITS/TRAYS/PACK) ×2 IMPLANT
SPLINT IMMOBILIZER J 3INX20FT (CAST SUPPLIES) ×1
SPLINT J IMMOBILIZER 3X20FT (CAST SUPPLIES) ×1 IMPLANT
SPLINT J IMMOBILIZER 4X20FT (CAST SUPPLIES) IMPLANT
SPLINT J PLASTER J 4INX20Y (CAST SUPPLIES)
SPONGE LAP 18X18 RF (DISPOSABLE) ×2 IMPLANT
STAPLER VISISTAT 35W (STAPLE) ×2 IMPLANT
SUT ETHILON 3 0 FSL (SUTURE) IMPLANT
SUT MON AB 0 CT1 (SUTURE) ×2 IMPLANT
SUT MON AB 2-0 CT1 36 (SUTURE) ×2 IMPLANT
SYNDESMOSIS TIGHTROPE XP (Orthopedic Implant) ×4 IMPLANT
SYR 30ML LL (SYRINGE) ×2 IMPLANT
SYR BULB IRRIG 60ML STRL (SYRINGE) ×2 IMPLANT
TOWEL OR 17X26 4PK STRL BLUE (TOWEL DISPOSABLE) ×6 IMPLANT

## 2020-09-28 NOTE — Transfer of Care (Signed)
Immediate Anesthesia Transfer of Care Note  Patient: Jose Vincent  Procedure(s) Performed: OPEN REDUCTION INTERNAL FIXATION (ORIF) ANKLE FRACTURE - LATERAL WITH SYNDESMOSIS REPAIR (Right Ankle)  Patient Location: PACU  Anesthesia Type:General  Level of Consciousness: drowsy  Airway & Oxygen Therapy: Patient Spontanous Breathing and Patient connected to face mask oxygen  Post-op Assessment: Report given to RN and Post -op Vital signs reviewed and stable  Post vital signs: Reviewed and stable  Last Vitals:  Vitals Value Taken Time  BP    Temp    Pulse 63 09/28/20 1203  Resp 18 09/28/20 1203  SpO2 97 % 09/28/20 1203  Vitals shown include unvalidated device data.  Last Pain:  Vitals:   09/28/20 0926  TempSrc: Oral  PainSc: 0-No pain         Complications: No complications documented.

## 2020-09-28 NOTE — Op Note (Signed)
09/28/2020 12:01 PM  Operative report for Jose Vincent  Preop diagnosis Weber C fracture right ankle  Postop diagnosis same   Procedure open reduction internal fixation right ankle with syndesmotic fixation  Surgeon Romeo Apple  Operative findings ruptured syndesmosis, torn deltoid ligament, comminuted fracture fibular shaft  Implants Arthrex tight rope x2 with Arthrex straight locking plate with 7 screws: 6 screws are nonlocking 1 is a locking screw there is 1 suture button in the plate and 1 out of the plate  Assisted by Canary Brim  General anesthesia  Details of surgery  Jose Vincent was seen in preop cleared for surgery chart review was completed implants were available x-rays were available and reviewed  Patient taken to the operating for general anesthesia placed in the supine position  After sterile prep and drape timeout was completed  Tourniquet was elevated to 300 mm  Prior to tourniquet elevation we used a 6 inch Esmarch  We made a lateral incision directly over the fracture extended proximally and distally divided subcutaneous tissue down to the peroneal fascia.  The excision was placed slightly anterior to the midshaft to allow full coverage of the plate  Once the fracture was exposed and debrided a straight locking Arthrex 8 hole plate was applied to the bone reducing the fracture and it was held with 2 clamps.  Radiographs confirmed reduction of the fracture and the ankle mortise  We proceeded to place screw starting with the obtuse angle portion of the fracture placing a nonlocking screw and then going to the acute angle of the fracture and placing a nonlocking screw.  Images confirm that we were in good position with the reduction and we completed the remaining screws.  1 screw was removed and per placed with a locking screw as it was at the area of comminution  We then placed a tight rope screw per manufacture technique and then took the distal screw out of the  plate and placed another tight rope  Final images confirm placement of all hardware was excellent.  Ankle mortise was reduced.  The wound was irrigated and closed with 0 Monocryl and staples and we injected 60 cc of Marcaine  The tourniquet was released sterile dressings were applied  A sugar tong splint was applied  The patient was extubated and taken to recovery room in stable condition

## 2020-09-28 NOTE — Brief Op Note (Signed)
09/28/2020  11:59 AM  PATIENT:  Jose Vincent  24 y.o. male  PRE-OPERATIVE DIAGNOSIS:  Syndesmotic rupture right ankle/ fracture right ankle  POST-OPERATIVE DIAGNOSIS:  Syndesmotic rupture right ankle/ fracture right ankle  PROCEDURE:  Procedure(s): OPEN REDUCTION INTERNAL FIXATION (ORIF) ANKLE FRACTURE - LATERAL WITH SYNDESMOSIS REPAIR (Right)  SURGEON:  Surgeon(s) and Role:    Vickki Hearing, MD - Primary  PHYSICIAN ASSISTANT:   ASSISTANTS: cynthia wrenn   ANESTHESIA:   general  EBL:  min   BLOOD ADMINISTERED:none  DRAINS: none   LOCAL MEDICATIONS USED:  MARCAINE     SPECIMEN:  No Specimen  DISPOSITION OF SPECIMEN:  N/A  COUNTS:  YES  TOURNIQUET:   Total Tourniquet Time Documented: Thigh (Right) - 65 minutes Total: Thigh (Right) - 65 minutes   DICTATION: .Reubin Milan Dictation  PLAN OF CARE: Discharge to home after PACU  PATIENT DISPOSITION:  PACU - hemodynamically stable.   Delay start of Pharmacological VTE agent (>24hrs) due to surgical blood loss or risk of bleeding: not applicable

## 2020-09-28 NOTE — Anesthesia Preprocedure Evaluation (Signed)
Anesthesia Evaluation  Patient identified by MRN, date of birth, ID band  Reviewed: Allergy & Precautions, NPO status , Patient's Chart, lab work & pertinent test results, reviewed documented beta blocker date and time   History of Anesthesia Complications Negative for: history of anesthetic complications  Airway Mallampati: II  TM Distance: >3 FB Neck ROM: Full    Dental no notable dental hx.    Pulmonary asthma , Current Smoker and Patient abstained from smoking.,    Pulmonary exam normal breath sounds clear to auscultation       Cardiovascular negative cardio ROS Normal cardiovascular exam Rhythm:Regular Rate:Normal     Neuro/Psych    GI/Hepatic negative GI ROS, Neg liver ROS,   Endo/Other  negative endocrine ROS  Renal/GU      Musculoskeletal negative musculoskeletal ROS (+)   Abdominal   Peds  Hematology negative hematology ROS (+)   Anesthesia Other Findings   Reproductive/Obstetrics                             Anesthesia Physical Anesthesia Plan  ASA: II  Anesthesia Plan: General   Post-op Pain Management:    Induction: Intravenous  PONV Risk Score and Plan:   Airway Management Planned: LMA  Additional Equipment:   Intra-op Plan:   Post-operative Plan: Extubation in OR  Informed Consent: I have reviewed the patients History and Physical, chart, labs and discussed the procedure including the risks, benefits and alternatives for the proposed anesthesia with the patient or authorized representative who has indicated his/her understanding and acceptance.     Dental advisory given  Plan Discussed with: CRNA  Anesthesia Plan Comments:         Anesthesia Quick Evaluation

## 2020-09-28 NOTE — Anesthesia Postprocedure Evaluation (Signed)
Anesthesia Post Note  Patient: Jose Vincent  Procedure(s) Performed: OPEN REDUCTION INTERNAL FIXATION (ORIF) ANKLE FRACTURE - LATERAL WITH SYNDESMOSIS REPAIR (Right Ankle)  Patient location during evaluation: PACU Anesthesia Type: General Level of consciousness: awake and alert and oriented Pain management: pain level controlled Vital Signs Assessment: post-procedure vital signs reviewed and stable Respiratory status: spontaneous breathing, nonlabored ventilation and respiratory function stable Postop Assessment: no apparent nausea or vomiting Anesthetic complications: no   No complications documented.   Last Vitals:  Vitals:   09/28/20 1230 09/28/20 1245  BP: (!) 155/95 (!) 158/97  Pulse: 63 (!) 59  Resp: 18 18  Temp:    SpO2: 96% 97%    Last Pain:  Vitals:   09/28/20 1230  TempSrc:   PainSc: 6                  Julian Reil

## 2020-09-28 NOTE — Anesthesia Postprocedure Evaluation (Signed)
Anesthesia Post Note  Patient: Jose Vincent  Procedure(s) Performed: OPEN REDUCTION INTERNAL FIXATION (ORIF) ANKLE FRACTURE - LATERAL WITH SYNDESMOSIS REPAIR (Right Ankle)  Patient location during evaluation: PACU Anesthesia Type: General Level of consciousness: awake and alert Pain management: pain level controlled Vital Signs Assessment: post-procedure vital signs reviewed and stable Respiratory status: spontaneous breathing, nonlabored ventilation, respiratory function stable and patient connected to nasal cannula oxygen Cardiovascular status: blood pressure returned to baseline and stable Postop Assessment: no apparent nausea or vomiting Anesthetic complications: no   No complications documented.   Last Vitals:  Vitals:   09/28/20 1325 09/28/20 1341  BP: (!) 150/95 (!) 138/94  Pulse: (!) 58 72  Resp: 18 19  Temp:  36.7 C  SpO2: 95% 96%    Last Pain:  Vitals:   09/28/20 1341  TempSrc: Oral  PainSc: 1                  Raji P Mellody Drown

## 2020-09-28 NOTE — Interval H&P Note (Signed)
History and Physical Interval Note:  09/28/2020 10:11 AM  Jose Vincent  has presented today for surgery, with the diagnosis of Syndesmotic rupture right ankle/ fracture right ankle.  The various methods of treatment have been discussed with the patient and family. After consideration of risks, benefits and other options for treatment, the patient has consented to  Procedure(s): OPEN REDUCTION INTERNAL FIXATION (ORIF) ANKLE FRACTURE - LATERAL WITH SYNDESMOSIS REPAIR (Right) as a surgical intervention.  The patient's history has been reviewed, patient examined, no change in status, stable for surgery.  I have reviewed the patient's chart and labs.  Questions were answered to the patient's satisfaction.     Fuller Canada

## 2020-09-28 NOTE — Anesthesia Procedure Notes (Signed)
Procedure Name: LMA Insertion Date/Time: 09/28/2020 10:24 AM Performed by: Julian Reil, CRNA Pre-anesthesia Checklist: Patient identified, Emergency Drugs available, Suction available and Patient being monitored Patient Re-evaluated:Patient Re-evaluated prior to induction Oxygen Delivery Method: Circle system utilized Preoxygenation: Pre-oxygenation with 100% oxygen Induction Type: IV induction Ventilation: Mask ventilation without difficulty LMA: LMA inserted LMA Size: 4.0 Tube type: Oral Number of attempts: 1 Placement Confirmation: positive ETCO2 Tube secured with: Tape Dental Injury: Teeth and Oropharynx as per pre-operative assessment

## 2020-09-29 ENCOUNTER — Telehealth: Payer: Self-pay | Admitting: Orthopedic Surgery

## 2020-09-29 ENCOUNTER — Encounter (HOSPITAL_COMMUNITY): Payer: Self-pay | Admitting: Orthopedic Surgery

## 2020-09-29 NOTE — Telephone Encounter (Signed)
Patient has question regarding medication, status/post surgery, done today, 09/29/20; requests to speak with nurse/clinical staffj.

## 2020-09-29 NOTE — Telephone Encounter (Signed)
He asked about the Toradol he is still using it some but not helping I told him to d/c and use ibuprofen 800mg  q 6 hours. Told him ok to use it with the Oxycodone   He voiced understanding  To you FYI

## 2020-10-01 ENCOUNTER — Telehealth: Payer: Self-pay | Admitting: Orthopedic Surgery

## 2020-10-01 NOTE — Telephone Encounter (Signed)
Brett wants to ask some questions regarding your previous conversation regarding the pain medication  Please call him   Thanks

## 2020-10-04 NOTE — Telephone Encounter (Signed)
Patient states he has finished the Oxycodone, and is doing fine without it.

## 2020-10-05 DIAGNOSIS — S82891A Other fracture of right lower leg, initial encounter for closed fracture: Secondary | ICD-10-CM | POA: Insufficient documentation

## 2020-10-06 ENCOUNTER — Ambulatory Visit (INDEPENDENT_AMBULATORY_CARE_PROVIDER_SITE_OTHER): Payer: Managed Care, Other (non HMO) | Admitting: Orthopedic Surgery

## 2020-10-06 ENCOUNTER — Other Ambulatory Visit: Payer: Self-pay

## 2020-10-06 ENCOUNTER — Encounter: Payer: Self-pay | Admitting: Orthopedic Surgery

## 2020-10-06 DIAGNOSIS — S82891D Other fracture of right lower leg, subsequent encounter for closed fracture with routine healing: Secondary | ICD-10-CM

## 2020-10-06 NOTE — Progress Notes (Signed)
Chief Complaint  Patient presents with  . Routine Post Op    09/28/20 right ankle syndesmosis repair ORIF    Tight rope x 2 plus lateral plate   Postop day #8 patient is doing well taking ibuprofen for pain is nonweightbearing  His incision looks good we can take his staples out next week  Then will take an x-ray  He is going to go on a nonweightbearing cam walker for now I will let him weight-bear postop week #4   Encounter Diagnosis  Name Primary?  . Closed fracture of right ankle with routine healing, subsequent encounter s/p ORIF 09/28/20 (button for syndesmosis repair) Yes

## 2020-10-06 NOTE — Patient Instructions (Signed)
No weight bearing   Keep incision dry   Ok to take the boot off for sleeping

## 2020-10-13 ENCOUNTER — Ambulatory Visit (INDEPENDENT_AMBULATORY_CARE_PROVIDER_SITE_OTHER): Payer: Managed Care, Other (non HMO) | Admitting: Orthopedic Surgery

## 2020-10-13 ENCOUNTER — Encounter: Payer: Self-pay | Admitting: Orthopedic Surgery

## 2020-10-13 ENCOUNTER — Other Ambulatory Visit: Payer: Self-pay

## 2020-10-13 ENCOUNTER — Ambulatory Visit: Payer: Managed Care, Other (non HMO)

## 2020-10-13 DIAGNOSIS — S82891D Other fracture of right lower leg, subsequent encounter for closed fracture with routine healing: Secondary | ICD-10-CM

## 2020-10-13 NOTE — Patient Instructions (Signed)
No weight-bearing

## 2020-10-13 NOTE — Progress Notes (Signed)
Chief Complaint  Patient presents with  . Routine Post Op    09/28/20 ORIF right ankle   Weber C type fracture  Patient doing well he is in a cam walker he has reasonable range of motion plantigrade foot  His wound looks good Staples were extracted his x-ray looks good he can continue nonweightbearing with active range of motion exercises right ankle follow-up 4 weeks for x-ray and start weightbearing at that time  Encounter Diagnosis  Name Primary?  . Closed fracture of right ankle with routine healing, subsequent encounter s/p ORIF 09/28/20 (button for syndesmosis repair) Yes

## 2020-11-10 ENCOUNTER — Ambulatory Visit: Payer: Managed Care, Other (non HMO)

## 2020-11-10 ENCOUNTER — Encounter: Payer: Self-pay | Admitting: Orthopedic Surgery

## 2020-11-10 ENCOUNTER — Other Ambulatory Visit: Payer: Self-pay

## 2020-11-10 ENCOUNTER — Ambulatory Visit (INDEPENDENT_AMBULATORY_CARE_PROVIDER_SITE_OTHER): Payer: Managed Care, Other (non HMO) | Admitting: Orthopedic Surgery

## 2020-11-10 VITALS — Ht 67.0 in | Wt 220.0 lb

## 2020-11-10 DIAGNOSIS — S82891D Other fracture of right lower leg, subsequent encounter for closed fracture with routine healing: Secondary | ICD-10-CM

## 2020-11-10 NOTE — Patient Instructions (Signed)
Weight bearing as tolerated in boot

## 2020-11-10 NOTE — Progress Notes (Signed)
Chief Complaint  Patient presents with  . Follow-up    Recheck on right ankle, DOS 09-28-20.   Leng is improving he is in a cam walker he has been compliant he does have a little bit of a scabbed area that was draining some purulent material it is not tender or erythematous or painful  He is using his walker but he can start weightbearing as tolerated in his cam walker now Preop diagnosis Weber C fracture right ankle   Postop diagnosis same    Procedure open reduction internal fixation right ankle with syndesmotic fixation   Surgeon Romeo Apple   Operative findings ruptured syndesmosis, torn deltoid ligament, comminuted fracture fibular shaft   Implants Arthrex tight rope x2 with Arthrex straight locking plate with 7 screws: 6 screws are nonlocking 1 is a locking screw there is 1 suture button in the plate and 1 out of the plate   X-ray shows 2 tight ropes 1 is through the plate comminuted fracture of the fibula with good alignment plate looks good  Weight-bear as tolerated follow-up on January 10 for x-ray and return to work January 11

## 2020-11-15 IMAGING — DX DG ANKLE COMPLETE 3+V*R*
3 series · 3 of 3 positions shown · non-contrast
Comparison: None.

CLINICAL DATA: Right foot and ankle pain, swelling and bruising
since a slip and fall yesterday playing paint ball. Initial
encounter.

EXAM:
RIGHT ANKLE - COMPLETE 3+ VIEW

[ankle ap]
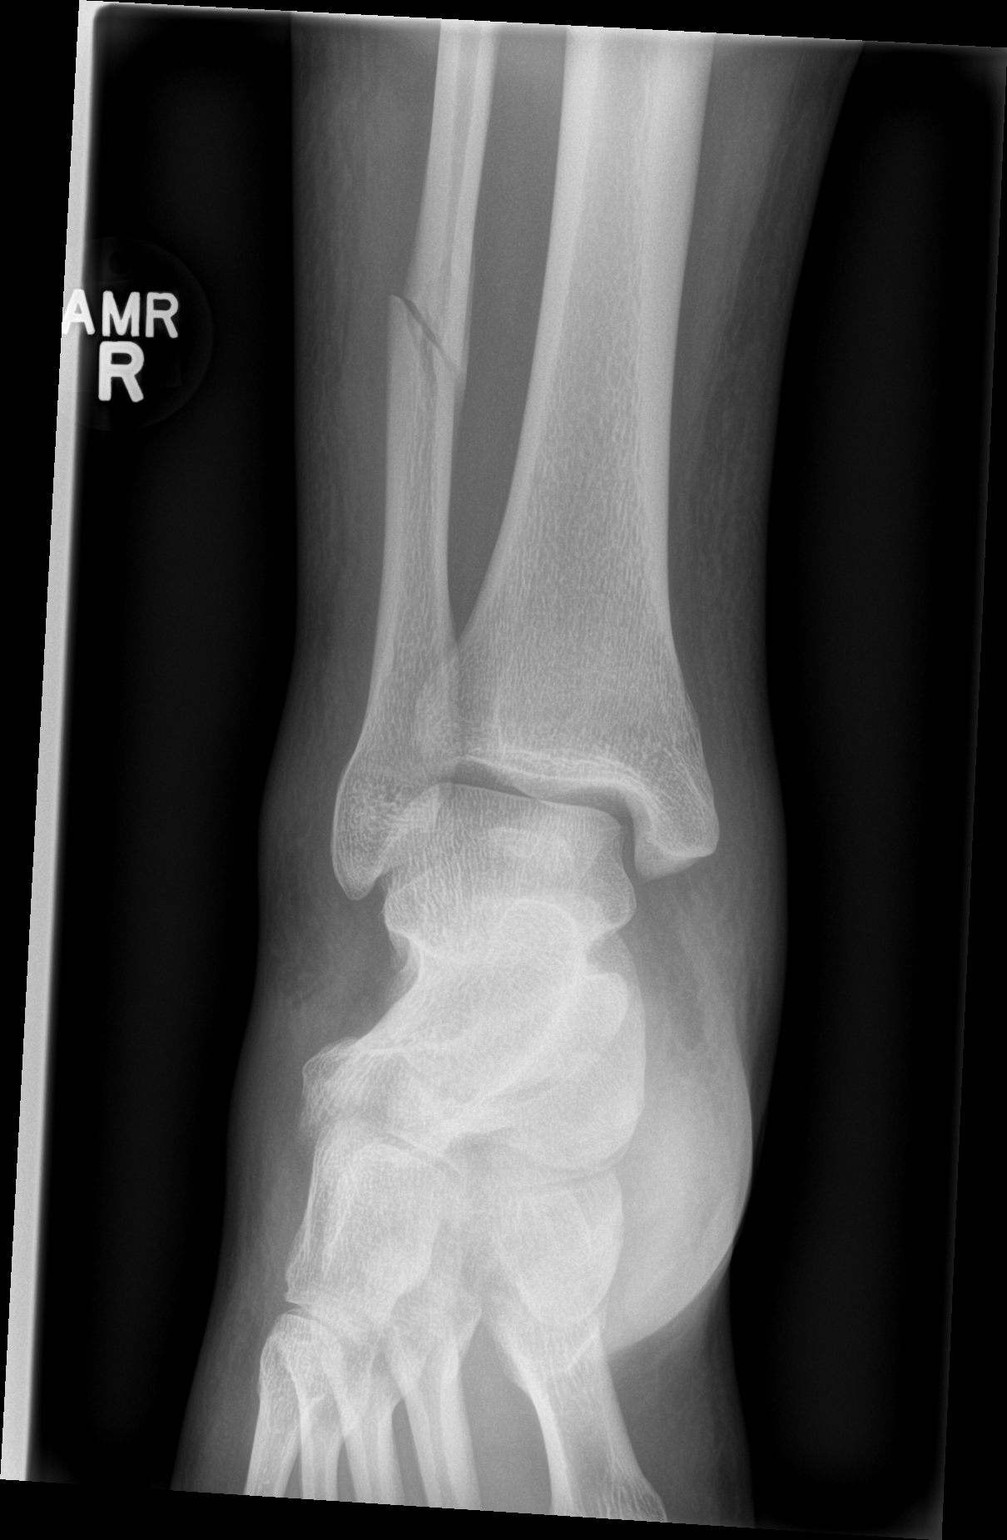

[ankle obl]
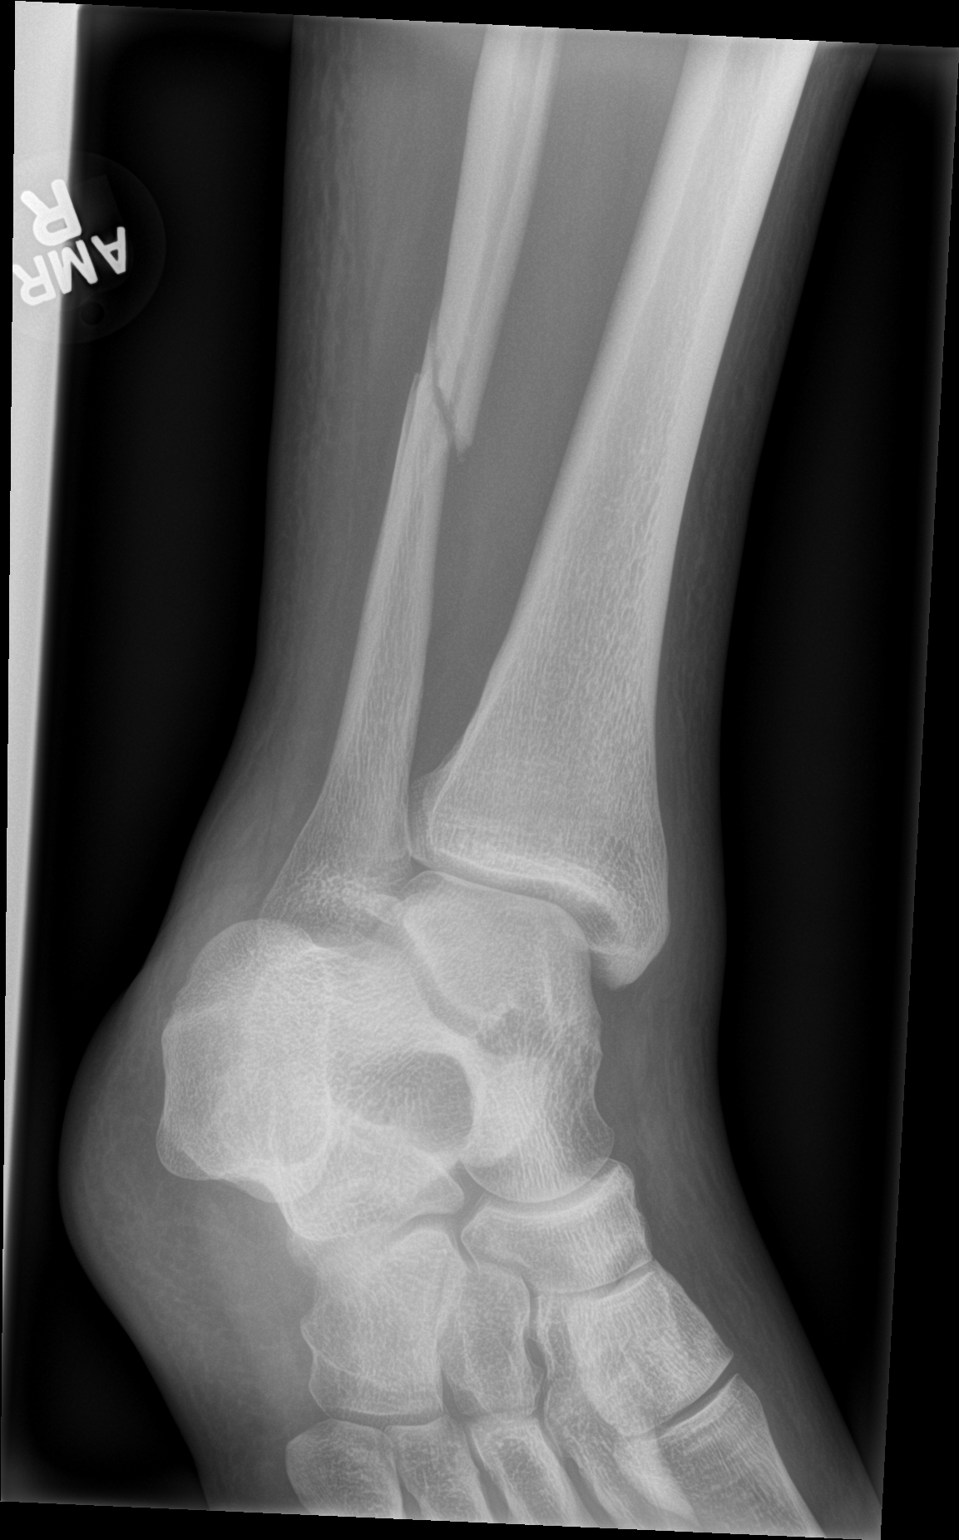

[ankle lat]
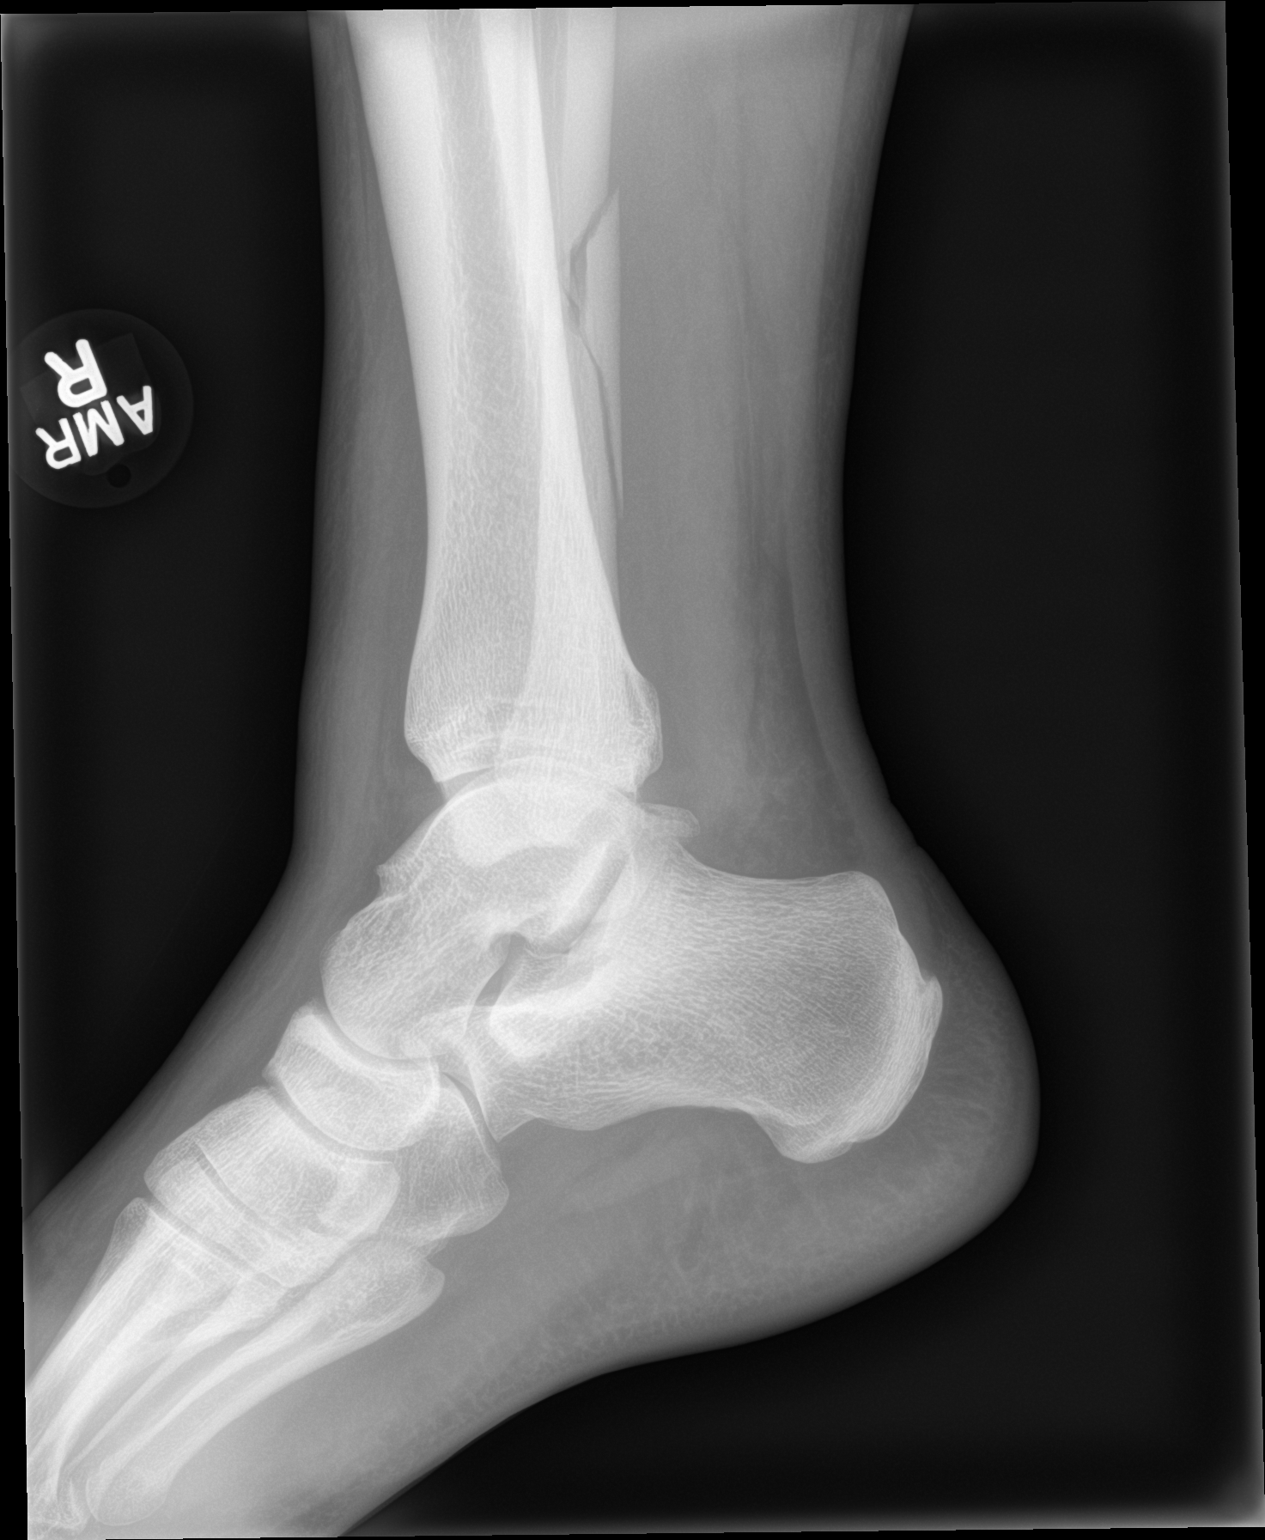

[3 of 3 positions shown; findings below may reference images not displayed]

FINDINGS: The patient has an acute fracture of the distal diaphysis of the
fibula with a butterfly fragment posteriorly. The fracture is
minimally displaced. Soft tissues are swollen
IMPRESSION: Acute fracture of the distal diaphysis of the right fibula with soft
tissue swelling as described.

## 2020-11-23 IMAGING — RF DG C-ARM 1-60 MIN
1 series · 15 of 16 positions shown · non-contrast
Comparison: September 23, 2020

CLINICAL DATA: Open reduction internal fixation for fracture

EXAM:
DG C-ARM 1-60 MIN; RIGHT ANKLE - 3 VIEW
FLUOROSCOPY TIME:  Fluoroscopy Time: 2 minutes 13 seconds
Number of Acquired Spot Images: 14

[Series 1: run · 15 of 16 slices shown]
[im 1/16]
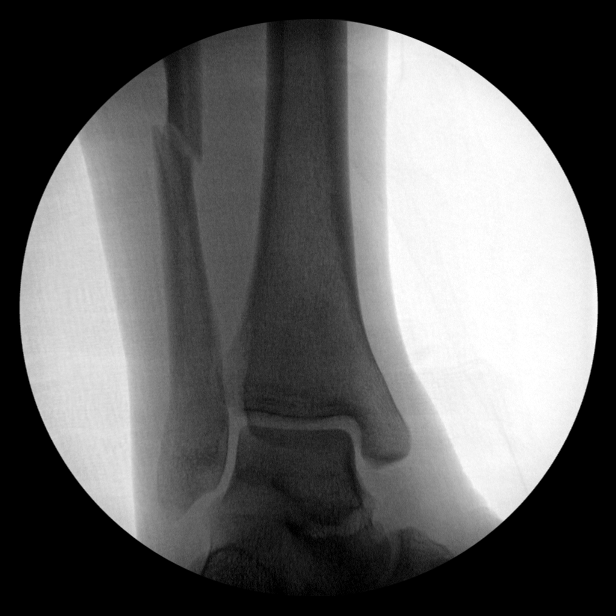
[im 2/16]
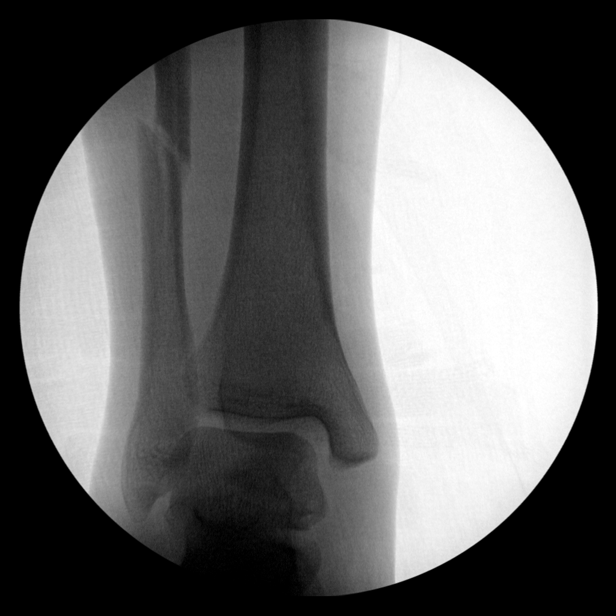
[im 3/16]
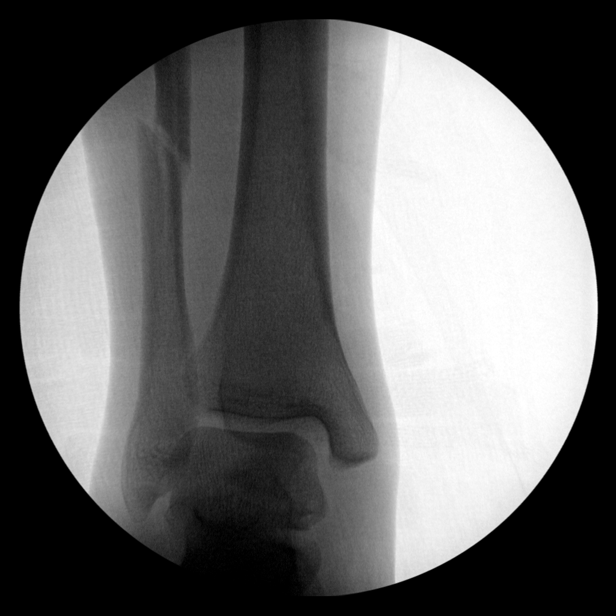
[im 4/16]
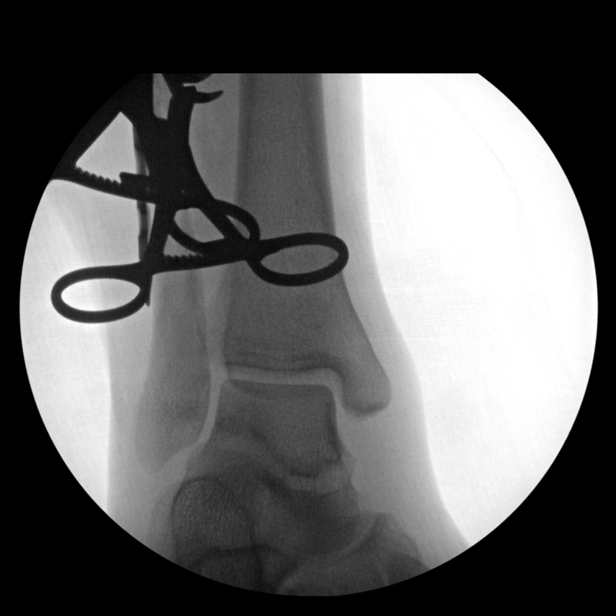
[im 5/16]
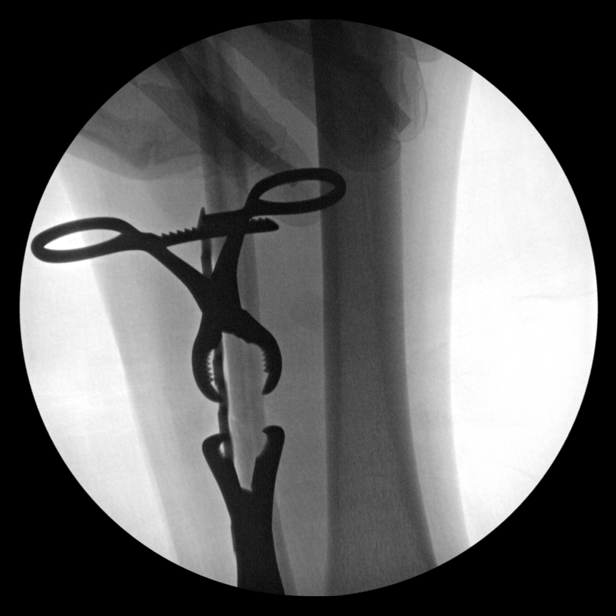
[im 6/16]
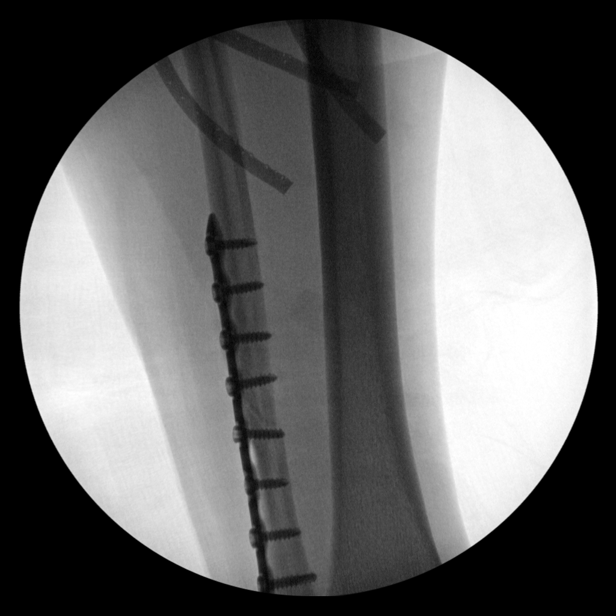
[im 7/16]
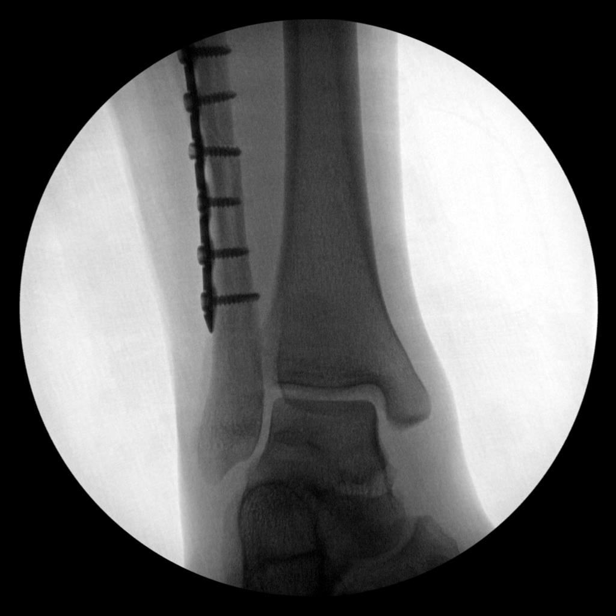
[im 9/16]
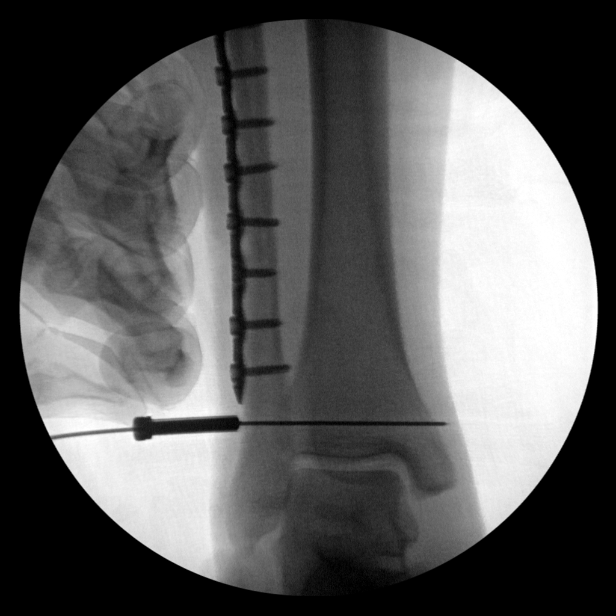
[im 10/16]
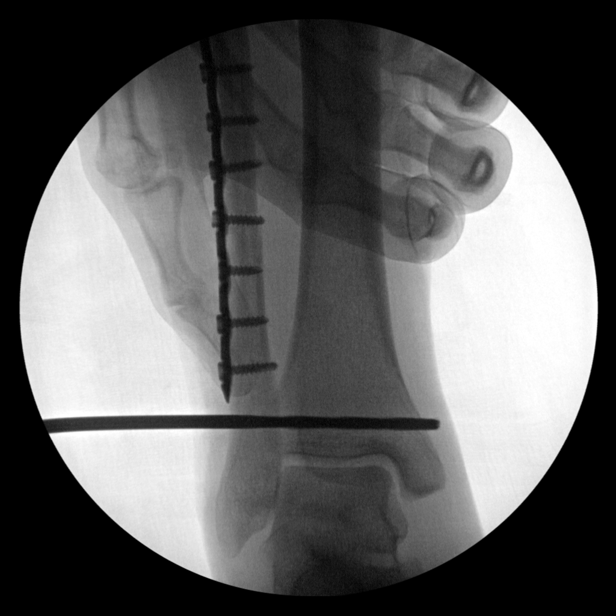
[im 11/16]
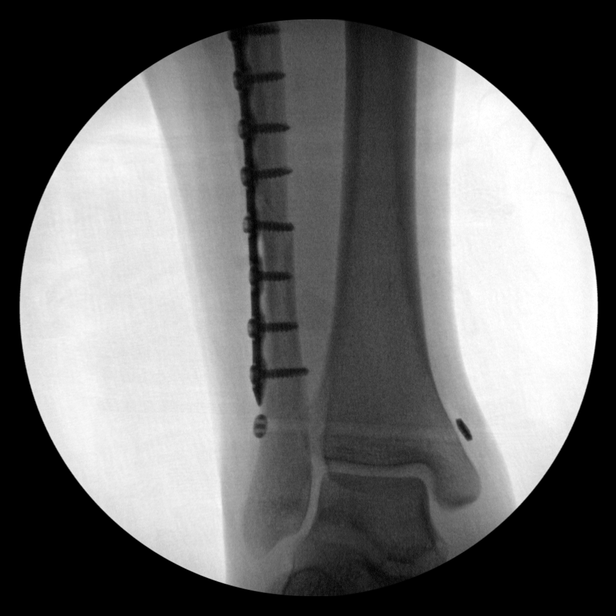
[im 12/16]
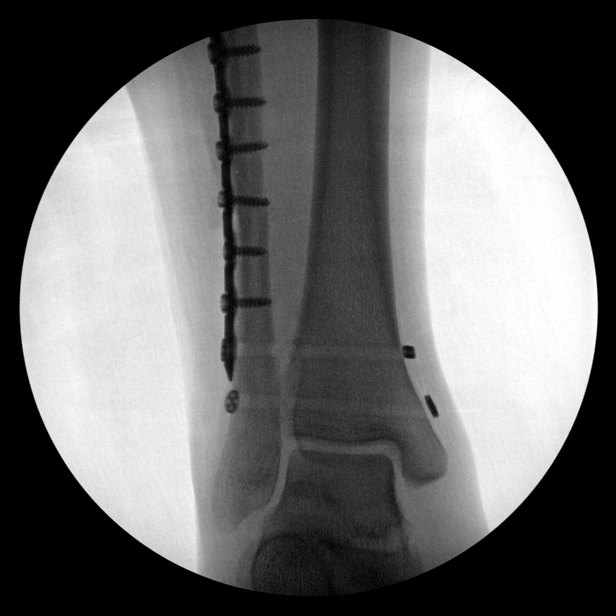
[im 13/16]
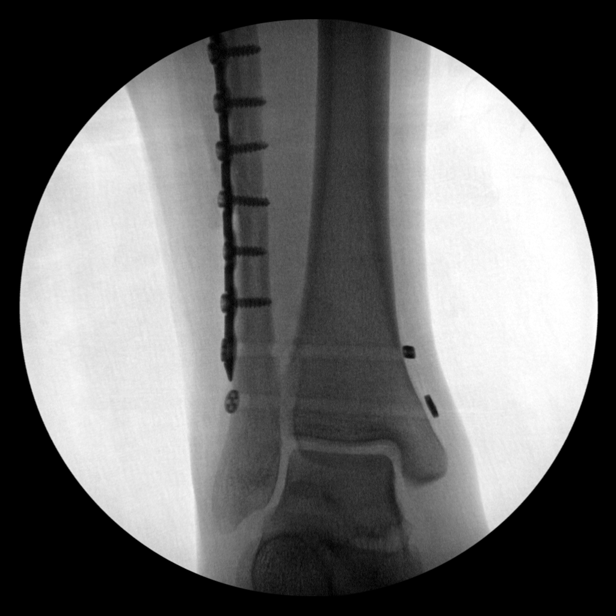
[im 14/16]
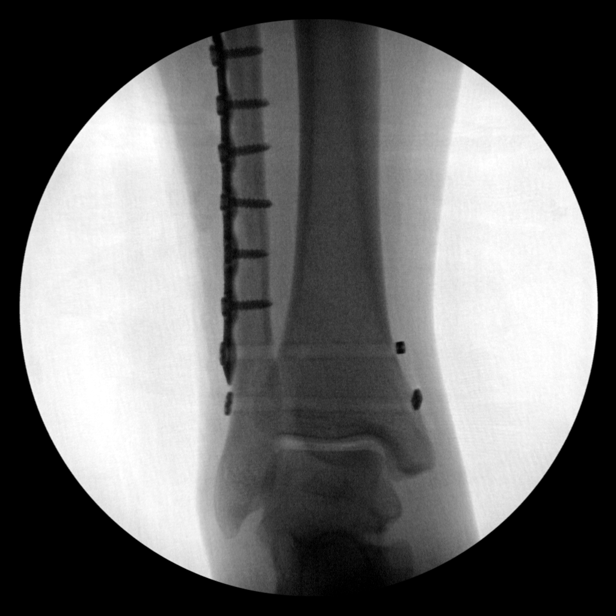
[im 15/16]
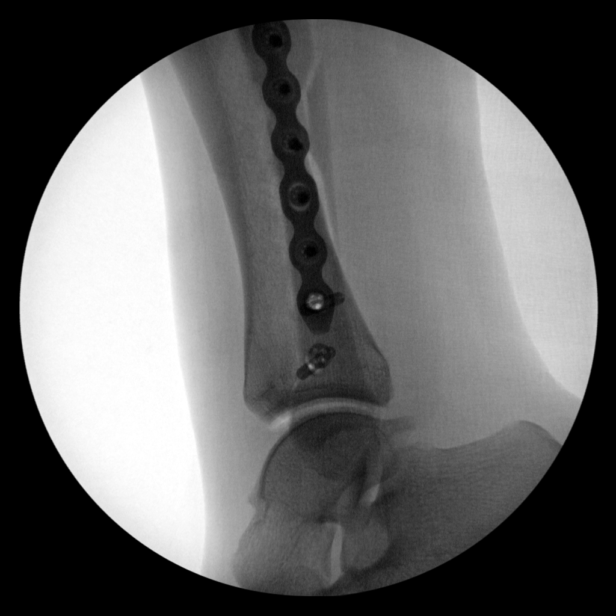
[im 16/16]
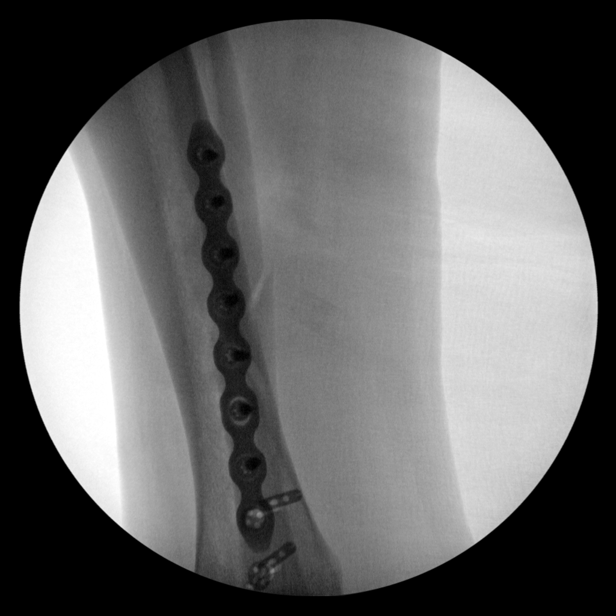

[15 of 16 positions shown; findings below may reference images not displayed]

FINDINGS: Frontal, oblique, and lateral views obtained. Series of images show
screw and plate fixation through a fracture of the distal fibular
diaphysis with alignment essentially anatomic after screw and plate
fixation. Tracks through the distal tibia and fibula noted without
hardware placed traversing these areas. Ankle mortise appears
intact. No appreciable joint space narrowing.
IMPRESSION: Screw and plate fixation traversing a fracture of the distal fibular
diaphysis with alignment essentially anatomic. Ankle mortise appears
intact. No appreciable joint space narrowing or erosion.

## 2020-12-13 ENCOUNTER — Encounter: Payer: Self-pay | Admitting: Orthopedic Surgery

## 2020-12-13 ENCOUNTER — Ambulatory Visit (INDEPENDENT_AMBULATORY_CARE_PROVIDER_SITE_OTHER): Payer: Managed Care, Other (non HMO) | Admitting: Orthopedic Surgery

## 2020-12-13 ENCOUNTER — Other Ambulatory Visit: Payer: Self-pay

## 2020-12-13 ENCOUNTER — Ambulatory Visit: Payer: Managed Care, Other (non HMO)

## 2020-12-13 VITALS — BP 128/81 | HR 74 | Ht 67.0 in

## 2020-12-13 DIAGNOSIS — S82891D Other fracture of right lower leg, subsequent encounter for closed fracture with routine healing: Secondary | ICD-10-CM | POA: Diagnosis not present

## 2020-12-13 NOTE — Patient Instructions (Signed)
RTW   FOLLOW UP AS NEEDED

## 2020-12-13 NOTE — Progress Notes (Signed)
POST OP  Chief Complaint  Patient presents with  . Ankle Pain    Left ankle pain.    09/28/20 ORIF   Tight rope fixation and plate fixation for his ankle fracture  Doing well full range of motion of the ankle x-ray looks good patient can return to work follow-up as needed  Encounter Diagnosis  Name Primary?  . Closed fracture of right ankle with routine healing, subsequent encounter Yes

## 2020-12-14 ENCOUNTER — Encounter: Payer: Self-pay | Admitting: Orthopedic Surgery

## 2021-07-29 ENCOUNTER — Encounter (HOSPITAL_COMMUNITY): Payer: Self-pay | Admitting: Emergency Medicine

## 2021-07-29 ENCOUNTER — Other Ambulatory Visit: Payer: Self-pay

## 2021-07-29 ENCOUNTER — Emergency Department (HOSPITAL_COMMUNITY)
Admission: EM | Admit: 2021-07-29 | Discharge: 2021-07-29 | Disposition: A | Discharge status: Home / Self Care | Payer: No Typology Code available for payment source | Attending: Emergency Medicine | Admitting: Emergency Medicine

## 2021-07-29 DIAGNOSIS — Z77098 Contact with and (suspected) exposure to other hazardous, chiefly nonmedicinal, chemicals: Secondary | ICD-10-CM

## 2021-07-29 DIAGNOSIS — F17210 Nicotine dependence, cigarettes, uncomplicated: Secondary | ICD-10-CM | POA: Insufficient documentation

## 2021-07-29 DIAGNOSIS — J45909 Unspecified asthma, uncomplicated: Secondary | ICD-10-CM | POA: Diagnosis not present

## 2021-07-29 DIAGNOSIS — R21 Rash and other nonspecific skin eruption: Secondary | ICD-10-CM | POA: Insufficient documentation

## 2021-07-29 MED ORDER — HYDROCORTISONE 1 % EX CREA: TOPICAL_CREAM | CUTANEOUS | 0 refills | Status: DC

## 2021-07-29 NOTE — ED Triage Notes (Addendum)
Pt reports while at work he came into contact w/ a chemical that cleans up water? We are currently waiting on a return call from boss to identify chemical.  Pt is now experiencing left hand numbness where he came into contact w/ it.  Pt washed area w/ water for 20 minutes. Denies SOB, chest pain and any other symptoms.  Poison control told pt to come to ED for evaluation.  Chemical is Bimetallic glassy phosphate

## 2021-07-29 NOTE — Discharge Instructions (Addendum)
Continue to wash exposed area with soap and water as needed, moisturize with lotion for dryness. Use steroid cream on forearms and avoid potential allergic triggers.

## 2021-07-29 NOTE — ED Provider Notes (Signed)
[facility] EMERGENCY DEPARTMENT Provider Note   CSN: [id number] Arrival date & time: 07/29/21  1850     History Chief Complaint  Patient presents with   Chemical Exposure    Jose Vincent is a 25 y.o. male.  Patient with no pertinent past medical history presents today after chemical exposure. Patient states that at work today he was exposed to bimetallic glassy phosphate powder on his left hand. Soon after, patient experienced numbness to left first, fourth, and fifth fingers. He subsequently scrubbed the area with soap and water for 20 minutes without resolution of symptoms. Sent to ED at request of poison control. However, numbness soon resolved and patient no longer has any complaints related to this issue at this time. Patient also complaining of pruritic rash to anterior portion of right and left forearms that appeared after helping a friend move 1 week ago.  The history is provided by the patient. No language interpreter was used.      Past Medical History:  Diagnosis Date   ADHD (attention deficit hyperactivity disorder)    On Concerta /Vyavnse in past   Asthma    Childhood- no inhaler since elementary age   Hyperlipidemia     Patient Active Problem List   Diagnosis Date Noted   Closed fracture of right ankle 10/05/2020   Closed displaced comminuted fracture of shaft of fibula with routine healing    Tobacco use disorder 10/01/2015   Depression 07/02/2015   Hypertriglyceridemia 06/08/2014   Routine infant or child health check 03/06/2014   Obesity 03/06/2014   Flat foot 03/06/2014    Past Surgical History:  Procedure Laterality Date   ORIF ANKLE FRACTURE Right 09/28/2020   Procedure: OPEN REDUCTION INTERNAL FIXATION (ORIF) ANKLE FRACTURE - LATERAL WITH SYNDESMOSIS REPAIR;  Surgeon: Vickki Hearing, MD;  Location: [facility];  Service: Orthopedics;  Laterality: Right;   TONSILLECTOMY         Family History  Problem Relation Age of Onset    Depression Mother    Hyperlipidemia Father    Hypertension Father    Hyperlipidemia Maternal Grandmother    Cancer Paternal Grandmother    Hypertension Paternal Grandmother    COPD Paternal Grandfather    Depression Paternal Grandfather    Heart disease Paternal Grandfather    Hypertension Paternal Grandfather    Mental illness Paternal Grandfather     Social History   Tobacco Use   Smoking status: Every Day    Packs/day: 0.25    Types: E-cigarettes, Cigarettes   Smokeless tobacco: Never  Vaping Use   Vaping Use: Every day   Start date: 09/25/2015  Substance Use Topics   Alcohol use: No    Alcohol/week: 0.0 standard drinks   Drug use: No    Frequency: 3.0 times per week    Home Medications Prior to Admission medications   Not on File    Allergies    Amoxicillin and Amoxicillin-pot clavulanate  Review of Systems   Review of Systems  Constitutional:  Negative for chills and fever.  Respiratory:  Negative for shortness of breath.   Cardiovascular:  Negative for chest pain.  Skin:  Positive for rash. Negative for color change and wound.  Allergic/Immunologic: Negative for environmental allergies and food allergies.  Neurological:  Negative for dizziness, tremors, seizures, syncope, weakness, light-headedness and numbness.  All other systems reviewed and are negative.  Physical Exam Updated Vital Signs BP 126/81 (BP Location: Right Arm)   Pulse 62  Temp 98.4 F (36.9 C) (Oral)   Resp 18   Ht 5\' 6"  (1.676 m)   Wt 99.8 kg   SpO2 98%   BMI 35.51 kg/m   Physical Exam Vitals and nursing note reviewed.  Constitutional:      General: He is not in acute distress.    Appearance: Normal appearance. He is not ill-appearing, toxic-appearing or diaphoretic.  HENT:     Head: Normocephalic.  Musculoskeletal:        General: No swelling, tenderness, deformity or signs of injury. Normal range of motion.  Skin:    General: Skin is warm and dry.     Findings: Rash  present. No petechiae or wound. Rash is papular.     Comments: Papular lichenified rash located to anterior left and right forearm  Neurological:     Mental Status: He is alert.    ED Results / Procedures / Treatments   Labs (all labs ordered are listed, but only abnormal results are displayed) Labs Reviewed - No data to display  EKG None  Radiology No results found.  Procedures Procedures   Medications Ordered in ED Medications - No data to display  ED Course  I have reviewed the triage vital signs and the nursing notes.  Pertinent labs & imaging results that were available during my care of the patient were reviewed by me and considered in my medical decision making (see chart for details).    MDM Rules/Calculators/A&P                         Patient presents for chemical exposure. Symptoms now resolved. Poison control recommends supportive care at this time. No lesions or burns noted to affected area. Full sensation and range of motion noted. Will recommend further cleaning with soap and water as needed and lotion for dryness  Patient also complains of papular pruritic rash to bilateral forearms for 1 week.  Afebrile and non-toxic appearing, no concern for widespread infectious process. No discharge or pus noted from lesions. Suspect contact dermatitis. Will give steroid cream and educate about avoiding triggering soaps and creams.   Final Clinical Impression(s) / ED Diagnoses Final diagnoses:  Chemical exposure  Rash    Rx / DC Orders ED Discharge Orders          Ordered    hydrocortisone cream 1 %        07/29/21 2228          An After Visit Summary was printed and given to the patient.    2229, Silva Bandy 07/29/21 2245    07/31/21, MD 07/30/21 [phone]

## 2021-07-29 NOTE — ED Notes (Addendum)
RN called poison control Angelique Blonder, had trouble finding compound (zinc).  Recommended flushing area w/ water well.  Supportive care, watching for chemical burn, usually w/in the first 2-4 hrs, we have reached that time.  Keep hands moisturized as it dries out skin.  Case closed unless provider has additional findings.

## 2021-07-29 NOTE — ED Provider Notes (Signed)
Emergency Medicine Provider Triage Evaluation Note  Jose Vincent , a 25 y.o. male  was evaluated in triage.  Pt complains of chemical exposure with numbness.  He is ambidextrous.  He states that 4pme had about 5 minutes of contact with bimetallic glassy phosphate.  He flushed this for about 20 minutes. He does not know when his last tetanus shot was.  He is primarily concerned that he has numbness in his small and ring finger.  He also had numbness in his thumb when this started however states that that is improving.  He denies any weakness or any other injuries.  He does not know when his last tetanus was.  Review of Systems  Positive: Decreased sensation in left hand.  Negative: Weakness  Physical Exam  BP 126/81 (BP Location: Right Arm)   Pulse 62   Temp 98.4 F (36.9 C) (Oral)   Resp 18   Ht 5\' 6"  (1.676 m)   Wt 99.8 kg   SpO2 98%   BMI 35.51 kg/m  Gen:   Awake, no distress   Resp:  Normal effort  MSK:   Moves extremities without difficulty  Other:  Subjective decrease sensation to light touch over left small and ring finger.  No pain over the elbow, he denies any other secondary injuries.  Sensation is currently intact to light touch to the left thumb.  No skin wounds visualized.  Medical Decision Making  Medically screening exam initiated at 8:14 PM.  Appropriate orders placed.  KAUSHAL VANNICE was informed that the remainder of the evaluation will be completed by another provider, this initial triage assessment does not replace that evaluation, and the importance of remaining in the ED until their evaluation is complete.  RN spoke with poison control, sounds like this is a zinc  based substance, however there is limited information about it.  Patient feels like his symptoms have been improving.  I gave him the option for observation, waiting in the waiting room for a room available in the back with reevaluation versus discharge from triage.  He elected for observation and  reevaluation.  Note: Portions of this report may have been transcribed using voice recognition software. Every effort was made to ensure accuracy; however, inadvertent computerized transcription errors may be present    Lavonna Monarch 07/29/21 2018    07/31/21, MD 08/01/21 [phone]

## 2023-02-01 ENCOUNTER — Encounter: Payer: Self-pay | Admitting: Radiology

## 2023-08-14 ENCOUNTER — Ambulatory Visit
Admission: RE | Admit: 2023-08-14 | Discharge: 2023-08-14 | Disposition: A | Discharge status: Home / Self Care | Payer: Managed Care, Other (non HMO) | Source: Ambulatory Visit | Attending: Nurse Practitioner | Admitting: Nurse Practitioner

## 2023-08-14 VITALS — BP 135/79 | HR 124 | Temp 98.7°F | Resp 18

## 2023-08-14 DIAGNOSIS — J069 Acute upper respiratory infection, unspecified: Secondary | ICD-10-CM | POA: Diagnosis present

## 2023-08-14 DIAGNOSIS — Z1152 Encounter for screening for COVID-19: Secondary | ICD-10-CM | POA: Insufficient documentation

## 2023-08-14 DIAGNOSIS — B349 Viral infection, unspecified: Secondary | ICD-10-CM | POA: Diagnosis present

## 2023-08-14 LAB — POCT INFLUENZA A/B
Influenza A, POC: NEGATIVE
Influenza B, POC: NEGATIVE

## 2023-08-14 MED ORDER — ONDANSETRON 4 MG PO TBDP: 4.0000 mg | ORAL_TABLET | Freq: Three times a day (TID) | ORAL | 0 refills | Status: DC | PRN

## 2023-08-14 MED ORDER — PROMETHAZINE-DM 6.25-15 MG/5ML PO SYRP: 5.0000 mL | ORAL_SOLUTION | Freq: Four times a day (QID) | ORAL | 0 refills | Status: DC | PRN

## 2023-08-14 NOTE — Discharge Instructions (Addendum)
The influenza test was negative.  A COVID test is pending.  You will be contacted if the pending test result is positive.  You will also have access to your results via MyChart. Take medication as prescribed. May take over-the-counter Tylenol or ibuprofen as needed for pain, fever, general discomfort. Increase fluids and allow for plenty of rest. Warm salt water gargles 3-4 times daily as needed for throat pain or discomfort. Recommend normal saline nasal spray throughout the day to help with nasal congestion and runny nose. For the cough, recommend using a humidifier in your bedroom at nighttime during sleep and sleeping elevated on pillows while cough symptoms persist. If your COVID test is positive and you have a fever, you will need to remain home until you have been fever free for 24 hours with no medication.  You are able to resume your normal activities if you have symptoms as long as you are wearing a mask. Follow-up in this clinic or with your primary care physician if you experience worsening symptoms, or if symptoms persist longer than 14 days. Follow-up as needed.

## 2023-08-14 NOTE — ED Provider Notes (Signed)
[facility]    CSN: [id number] Arrival date & time: 08/14/23  1738      History   Chief Complaint Chief Complaint  Patient presents with   Fever    Cough, fatigue, sore throat - Entered by patient    HPI Jose Vincent is a 27 y.o. male.   The history is provided by the patient.   Patient presents for complaints of fatigue, chills, body aches, nasal congestion, and cough that started over the past 24 hours.  Patient denies fever, sore throat, runny nose, wheezing, difficulty breathing, abdominal pain, nausea, vomiting, or diarrhea.  Patient reports that he has not taken any medication for his symptoms.  He reports that there was someone at his job who tested positive for COVID, but he has not been around him.   Past Medical History:  Diagnosis Date   ADHD (attention deficit hyperactivity disorder)    On Concerta /Vyavnse in past   Asthma    Childhood- no inhaler since elementary age   Hyperlipidemia     Patient Active Problem List   Diagnosis Date Noted   Closed fracture of right ankle 10/05/2020   Closed displaced comminuted fracture of shaft of fibula with routine healing    Tobacco use disorder 10/01/2015   Depression 07/02/2015   Hypertriglyceridemia 06/08/2014   Routine infant or child health check 03/06/2014   Obesity 03/06/2014   Flat foot 03/06/2014    Past Surgical History:  Procedure Laterality Date   ORIF ANKLE FRACTURE Right 09/28/2020   Procedure: OPEN REDUCTION INTERNAL FIXATION (ORIF) ANKLE FRACTURE - LATERAL WITH SYNDESMOSIS REPAIR;  Surgeon: Vickki Hearing, MD;  Location: [facility];  Service: Orthopedics;  Laterality: Right;   TONSILLECTOMY         Home Medications    Prior to Admission medications   Medication Sig Start Date End Date Taking? Authorizing Provider  ondansetron (ZOFRAN-ODT) 4 MG disintegrating tablet Take 1 tablet (4 mg total) by mouth every 8 (eight) hours as needed. 08/14/23  Yes Kostas Marrow-Warren, Sadie Haber, NP   promethazine-dextromethorphan (PROMETHAZINE-DM) 6.25-15 MG/5ML syrup Take 5 mLs by mouth 4 (four) times daily as needed. 08/14/23  Yes Damion Kant-Warren, Sadie Haber, NP    Family History Family History  Problem Relation Age of Onset   Depression Mother    Hyperlipidemia Father    Hypertension Father    Hyperlipidemia Maternal Grandmother    Cancer Paternal Grandmother    Hypertension Paternal Grandmother    COPD Paternal Grandfather    Depression Paternal Grandfather    Heart disease Paternal Grandfather    Hypertension Paternal Grandfather    Mental illness Paternal Grandfather     Social History Social History   Tobacco Use   Smoking status: Every Day    Current packs/day: 0.25    Types: E-cigarettes, Cigarettes   Smokeless tobacco: Never  Vaping Use   Vaping status: Every Day   Start date: 09/25/2015  Substance Use Topics   Alcohol use: No    Alcohol/week: 0.0 standard drinks of alcohol   Drug use: No    Frequency: 3.0 times per week     Allergies   Amoxicillin and Amoxicillin-pot clavulanate   Review of Systems Review of Systems Per HPI  Physical Exam Triage Vital Signs ED Triage Vitals  Encounter Vitals Group     BP 08/14/23 1820 135/79     Systolic BP Percentile --      Diastolic BP Percentile --      Pulse Rate  08/14/23 1820 (!) 124     Resp 08/14/23 1820 18     Temp 08/14/23 1820 98.7 F (37.1 C)     Temp Source 08/14/23 1820 Oral     SpO2 08/14/23 1820 95 %     Weight --      Height --      Head Circumference --      Peak Flow --      Pain Score 08/14/23 1822 5     Pain Loc --      Pain Education --      Exclude from Growth Chart --    No data found.  Updated Vital Signs BP 135/79 (BP Location: Right Arm)   Pulse (!) 124   Temp 98.7 F (37.1 C) (Oral)   Resp 18   SpO2 95%   Visual Acuity Right Eye Distance:   Left Eye Distance:   Bilateral Distance:    Right Eye Near:   Left Eye Near:    Bilateral Near:     Physical  Exam Vitals and nursing note reviewed.  Constitutional:      General: He is not in acute distress.    Appearance: Normal appearance.  HENT:     Head: Normocephalic.     Right Ear: Tympanic membrane, ear canal and external ear normal.     Left Ear: Tympanic membrane, ear canal and external ear normal.     Nose: Congestion present.     Mouth/Throat:     Mouth: Mucous membranes are moist.     Pharynx: No posterior oropharyngeal erythema.  Eyes:     Extraocular Movements: Extraocular movements intact.     Conjunctiva/sclera: Conjunctivae normal.     Pupils: Pupils are equal, round, and reactive to light.  Cardiovascular:     Rate and Rhythm: Regular rhythm.     Pulses: Normal pulses.     Heart sounds: Normal heart sounds.  Pulmonary:     Effort: Pulmonary effort is normal. No respiratory distress.     Breath sounds: Normal breath sounds. No stridor. No wheezing, rhonchi or rales.  Abdominal:     General: Bowel sounds are normal.     Palpations: Abdomen is soft.     Tenderness: There is no abdominal tenderness.  Musculoskeletal:     Cervical back: Normal range of motion.  Lymphadenopathy:     Cervical: No cervical adenopathy.  Skin:    General: Skin is warm and dry.  Neurological:     General: No focal deficit present.     Mental Status: He is alert and oriented to person, place, and time.  Psychiatric:        Mood and Affect: Mood normal.        Behavior: Behavior normal.      UC Treatments / Results  Labs (all labs ordered are listed, but only abnormal results are displayed) Labs Reviewed  SARS CORONAVIRUS 2 (TAT 6-24 HRS)  POCT INFLUENZA A/B    EKG   Radiology No results found.  Procedures Procedures (including critical care time)  Medications Ordered in UC Medications - No data to display  Initial Impression / Assessment and Plan / UC Course  I have reviewed the triage vital signs and the nursing notes.  Pertinent labs & imaging results that were  available during my care of the patient were reviewed by me and considered in my medical decision making (see chart for details).   The patient is well-appearing, he is in no acute  distress, vital signs are stable.  Influenza test was negative.  COVID test.  Patient is able to receive Paxlovid if his COVID test is positive.  Suspect symptoms are related to viral upper respiratory infection. Will provide symptomatic treatment with mepazine DM for the cough and Zofran 4 mg ODT for nausea.  Supportive care recommendations were provided and discussed with the patient to include increasing fluids, allowing for plenty of rest, over-the-counter analgesics, normal saline nasal spray, and warm salt water gargles.  Discussed viral etiology with the patient and when follow-up may be indicated.  Also discussed with patient that if his test is positive for COVID, he should remain home until his been fever free for 24 hours with no medication.  Patient is in agreement with this plan of care and verbalizes understanding.  All questions were answered.  Patient stable for discharge.  Work note was provided.   Final Clinical Impressions(s) / UC Diagnoses   Final diagnoses:  Encounter for screening for COVID-19  Viral upper respiratory tract infection with cough  Viral illness     Discharge Instructions      The influenza test was negative.  A COVID test is pending.  You will be contacted if the pending test result is positive.  You will also have access to your results via MyChart. Take medication as prescribed. May take over-the-counter Tylenol or ibuprofen as needed for pain, fever, general discomfort. Increase fluids and allow for plenty of rest. Warm salt water gargles 3-4 times daily as needed for throat pain or discomfort. Recommend normal saline nasal spray throughout the day to help with nasal congestion and runny nose. For the cough, recommend using a humidifier in your bedroom at nighttime during  sleep and sleeping elevated on pillows while cough symptoms persist. If your COVID test is positive and you have a fever, you will need to remain home until you have been fever free for 24 hours with no medication.  You are able to resume your normal activities if you have symptoms as long as you are wearing a mask. Follow-up in this clinic or with your primary care physician if you experience worsening symptoms, or if symptoms persist longer than 14 days. Follow-up as needed.     ED Prescriptions     Medication Sig Dispense Auth. Provider   ondansetron (ZOFRAN-ODT) 4 MG disintegrating tablet Take 1 tablet (4 mg total) by mouth every 8 (eight) hours as needed. 20 tablet Raygen Linquist-Warren, Sadie Haber, NP   promethazine-dextromethorphan (PROMETHAZINE-DM) 6.25-15 MG/5ML syrup Take 5 mLs by mouth 4 (four) times daily as needed. 118 mL Lorien Shingler-Warren, Sadie Haber, NP      PDMP not reviewed this encounter.   Abran Cantor, NP 08/14/23 1901

## 2023-08-14 NOTE — ED Triage Notes (Signed)
Cough, fever, lower back pain, feet ache chills, fatigue since last night.

## 2023-08-15 ENCOUNTER — Telehealth: Payer: Self-pay | Admitting: Family Medicine

## 2023-08-15 LAB — SARS CORONAVIRUS 2 (TAT 6-24 HRS): SARS Coronavirus 2: POSITIVE — AB

## 2023-08-15 MED ORDER — PAXLOVID (300/100) 20 X 150 MG & 10 X 100MG PO TBPK: 3.0000 | ORAL_TABLET | Freq: Two times a day (BID) | ORAL | 0 refills | Status: AC

## 2023-08-15 NOTE — Telephone Encounter (Signed)
Positive for COVID, requesting paxlovid. Sent

## 2023-11-12 NOTE — Progress Notes (Unsigned)
   New Patient Office Visit   Subjective   Patient ID: HAMILTON HEBERLEIN, male    DOB: 01-Nov-1996  Age: 27 y.o. MRN: [id number]  CC: No chief complaint on file.   HPI ALGIRDAS SOINE 26 year old male, presents to establish care. He  has a past medical history of ADHD (attention deficit hyperactivity disorder), Asthma, and Hyperlipidemia.  Insomnia   High Blood Pressure (Hypertension): Sudden spikes in blood pressure can cause headaches and nosebleeds. Dry Air or Irritants: Dry environments or exposure to smoke and chemicals can irritate nasal passages, leading to nosebleeds and potential headache discomfort.   Outpatient Encounter Medications as of 11/13/2023  Medication Sig   ondansetron (ZOFRAN-ODT) 4 MG disintegrating tablet Take 1 tablet (4 mg total) by mouth every 8 (eight) hours as needed.   promethazine-dextromethorphan (PROMETHAZINE-DM) 6.25-15 MG/5ML syrup Take 5 mLs by mouth 4 (four) times daily as needed.   No facility-administered encounter medications on file as of 11/13/2023.    Past Surgical History:  Procedure Laterality Date   ORIF ANKLE FRACTURE Right 09/28/2020   Procedure: OPEN REDUCTION INTERNAL FIXATION (ORIF) ANKLE FRACTURE - LATERAL WITH SYNDESMOSIS REPAIR;  Surgeon: Vickki Hearing, MD;  Location: [facility];  Service: Orthopedics;  Laterality: Right;   TONSILLECTOMY      Review of Systems  Psychiatric/Behavioral:  The patient has insomnia.       Objective    There were no vitals taken for this visit.  Physical Exam    Assessment & Plan:  There are no diagnoses linked to this encounter.  No follow-ups on file.   Cruzita Lederer Newman Nip, FNP

## 2023-11-12 NOTE — Patient Instructions (Signed)

## 2023-11-13 ENCOUNTER — Encounter: Payer: Self-pay | Admitting: Family Medicine

## 2023-11-13 ENCOUNTER — Ambulatory Visit: Payer: Managed Care, Other (non HMO) | Admitting: Family Medicine

## 2023-11-13 VITALS — BP 126/82 | HR 63 | Ht 66.0 in | Wt 240.0 lb

## 2023-11-13 DIAGNOSIS — R7301 Impaired fasting glucose: Secondary | ICD-10-CM | POA: Diagnosis not present

## 2023-11-13 DIAGNOSIS — Z1159 Encounter for screening for other viral diseases: Secondary | ICD-10-CM

## 2023-11-13 DIAGNOSIS — E559 Vitamin D deficiency, unspecified: Secondary | ICD-10-CM

## 2023-11-13 DIAGNOSIS — G4730 Sleep apnea, unspecified: Secondary | ICD-10-CM | POA: Insufficient documentation

## 2023-11-13 DIAGNOSIS — D509 Iron deficiency anemia, unspecified: Secondary | ICD-10-CM

## 2023-11-13 DIAGNOSIS — Z0001 Encounter for general adult medical examination with abnormal findings: Secondary | ICD-10-CM

## 2023-11-13 DIAGNOSIS — T7840XA Allergy, unspecified, initial encounter: Secondary | ICD-10-CM

## 2023-11-13 DIAGNOSIS — Z136 Encounter for screening for cardiovascular disorders: Secondary | ICD-10-CM

## 2023-11-13 DIAGNOSIS — E038 Other specified hypothyroidism: Secondary | ICD-10-CM

## 2023-11-13 DIAGNOSIS — Z0100 Encounter for examination of eyes and vision without abnormal findings: Secondary | ICD-10-CM

## 2023-11-13 NOTE — Assessment & Plan Note (Signed)

## 2023-11-13 NOTE — Assessment & Plan Note (Signed)
Stop bang score 4 points Referral placed to sleep studies

## 2023-11-13 NOTE — Progress Notes (Signed)
Complete physical exam  Patient: Jose Vincent   DOB: 09-22-1996   27 y.o. Male  MRN: [id number]  Subjective:    Chief Complaint  Patient presents with   Establish Care    Chronic nose bleeds w/ headaches.  lasting 5-10 min has had nose cauterized. Wake / sleep cycle interrupted.  Random rashes in different body locations.      Jose Vincent is a 27 y.o. male who presents today for a complete physical exam. He reports consuming a low fat diet. The patient has a physically strenuous job, but has no regular exercise apart from work.  He generally feels fine. He reports sleeping poorly. He does have additional problems to discuss today.    Most recent fall risk assessment:    11/13/2023   10:31 AM  Fall Risk   Falls in the past year? 0  Number falls in past yr: 0  Injury with Fall? 0  Risk for fall due to : No Fall Risks  Follow up Falls evaluation completed     Most recent depression screenings:    11/13/2023   10:30 AM 02/04/2019   11:36 AM  PHQ 2/9 Scores  PHQ - 2 Score 0 0  PHQ- 9 Score 9     Vision:Not within last year  and Dental: No current dental problems and No regular dental care   Patient Care Team: Del Newman Nip, Tenna Child, FNP as PCP - General (Family Medicine)   Outpatient Medications Prior to Visit  Medication Sig   [DISCONTINUED] ondansetron (ZOFRAN-ODT) 4 MG disintegrating tablet Take 1 tablet (4 mg total) by mouth every 8 (eight) hours as needed.   [DISCONTINUED] promethazine-dextromethorphan (PROMETHAZINE-DM) 6.25-15 MG/5ML syrup Take 5 mLs by mouth 4 (four) times daily as needed.   No facility-administered medications prior to visit.    Review of Systems  Constitutional:  Negative for chills and fever.  HENT:  Negative for ear pain.   Eyes:  Negative for blurred vision.  Respiratory:  Negative for shortness of breath.   Cardiovascular:  Negative for chest pain.  Gastrointestinal:  Negative for abdominal pain.  Genitourinary:  Negative for  dysuria.  Musculoskeletal:  Negative for myalgias.  Skin:  Positive for rash.  Neurological:  Positive for headaches.       Objective:    BP 126/82   Pulse 63   Ht 5\' 6"  (1.676 m)   Wt 240 lb 0.6 oz (108.9 kg)   SpO2 95%   BMI 38.74 kg/m  BP Readings from Last 3 Encounters:  11/13/23 126/82  08/14/23 135/79  07/29/21 124/82      Physical Exam Vitals reviewed.  Constitutional:      General: He is not in acute distress.    Appearance: Normal appearance. He is not ill-appearing, toxic-appearing or diaphoretic.  HENT:     Head: Normocephalic.     Right Ear: Tympanic membrane normal.     Left Ear: Tympanic membrane normal.     Mouth/Throat:     Mouth: Mucous membranes are moist.  Eyes:     General:        Right eye: No discharge.        Left eye: No discharge.     Conjunctiva/sclera: Conjunctivae normal.     Pupils: Pupils are equal, round, and reactive to light.  Cardiovascular:     Rate and Rhythm: Normal rate.     Pulses: Normal pulses.     Heart sounds: Normal heart sounds.  Pulmonary:  Effort: Pulmonary effort is normal. No respiratory distress.     Breath sounds: Normal breath sounds.  Abdominal:     General: Bowel sounds are normal.     Palpations: Abdomen is soft.     Tenderness: There is no abdominal tenderness. There is no right CVA tenderness, left CVA tenderness or guarding.  Musculoskeletal:        General: Normal range of motion.     Cervical back: Normal range of motion.  Skin:    General: Skin is warm and dry.     Capillary Refill: Capillary refill takes less than 2 seconds.  Neurological:     Mental Status: He is alert.     Coordination: Coordination normal.     Gait: Gait normal.  Psychiatric:        Mood and Affect: Mood normal.        Behavior: Behavior normal.      No results found for any visits on 11/13/23.    Assessment & Plan:    Routine Health Maintenance and Physical Exam  Immunization History  Administered Date(s)  Administered   DTaP 08/12/1997, 10/12/1997, 12/23/1997, 06/22/1998, 03/24/2002   HIB (PRP-OMP) 08/12/1997, 10/12/1997, 12/23/1997, 06/22/1998   Hepatitis B 08-Jan-1997, 08/12/1997, 12/23/1997   IPV 08/12/1997, 10/12/1997, 03/24/2002   MMR 06/22/1998, 03/24/2002   Meningococcal B, OMV 02/01/2015, 07/02/2015   Meningococcal Conjugate 02/01/2015   Td 08/12/2009   Tdap 04/26/2023   Varicella 07/02/2015    Health Maintenance  Topic Date Due   HPV VACCINES (1 - Male 3-dose series) Never done   Hepatitis C Screening  Never done   COVID-19 Vaccine (1 - 2023-24 season) 11/29/2023 (Originally 08/05/2023)   INFLUENZA VACCINE  03/03/2024 (Originally 07/05/2023)   DTaP/Tdap/Td (8 - Td or Tdap) 04/25/2033   HIV Screening  Completed    Discussed health benefits of physical activity, and encouraged him to engage in regular exercise appropriate for his age and condition.  IFG (impaired fasting glucose) -     Microalbumin / creatinine urine ratio -     Hemoglobin A1c  Need for hepatitis C screening test -     Hepatitis C antibody  Vitamin D deficiency -     VITAMIN D 25 Hydroxy (Vit-D Deficiency, Fractures)  TSH (thyroid-stimulating hormone deficiency) -     TSH + free T4  Encounter for screening for cardiovascular disorders -     Lipid panel -     CMP14+EGFR -     CBC with Differential/Platelet  Iron deficiency anemia, unspecified iron deficiency anemia type -     Iron, TIBC and Ferritin Panel -     Vitamin B12  Sleep apnea, unspecified type Assessment & Plan: Stop bang score 4 points Referral placed to sleep studies  Orders: -     Ambulatory referral to Sleep Studies  Routine eye exam -     Ambulatory referral to Ophthalmology  Allergy, initial encounter -     Ambulatory referral to Allergy  Encounter for routine adult physical exam with abnormal findings Assessment & Plan: A comprehensive physical examination was completed, and necessary labs were ordered. Screening and  health maintenance recommendations have been updated. The patient received counseling on exercise and nutrition. BMI was assessed and discussed Advise for heart health, focus on: Eat more fruits and vegetables: Aim for a variety of colors. Choose whole grains: Brown rice, oats, and whole-wheat bread. Limit unhealthy fats: Avoid trans fats; use olive or avocado oil instead. Include lean proteins: Opt for fish,  chicken, beans, and legumes. Reduce sodium: Limit processed foods and add less salt. Stay hydrated: Drink plenty of water. Exercise regularly: Aim for at least 30 minutes of moderate exercise, like walking or cycling, 5 days a week.       Return in about 6 months (around 05/13/2024), or if symptoms worsen or fail to improve, for routine labs.     Cruzita Lederer Newman Nip, FNP

## 2023-11-16 ENCOUNTER — Emergency Department (HOSPITAL_COMMUNITY): Payer: Managed Care, Other (non HMO)

## 2023-11-16 ENCOUNTER — Emergency Department (HOSPITAL_COMMUNITY)
Admission: EM | Admit: 2023-11-16 | Discharge: 2023-11-16 | Disposition: A | Discharge status: Home / Self Care | Payer: Managed Care, Other (non HMO) | Attending: Emergency Medicine | Admitting: Emergency Medicine

## 2023-11-16 ENCOUNTER — Other Ambulatory Visit: Payer: Self-pay

## 2023-11-16 ENCOUNTER — Encounter (HOSPITAL_COMMUNITY): Payer: Self-pay

## 2023-11-16 DIAGNOSIS — G43909 Migraine, unspecified, not intractable, without status migrainosus: Secondary | ICD-10-CM | POA: Diagnosis not present

## 2023-11-16 DIAGNOSIS — F17200 Nicotine dependence, unspecified, uncomplicated: Secondary | ICD-10-CM | POA: Insufficient documentation

## 2023-11-16 DIAGNOSIS — R519 Headache, unspecified: Secondary | ICD-10-CM | POA: Diagnosis present

## 2023-11-16 LAB — CBC WITH DIFFERENTIAL/PLATELET
Basophils Absolute: 0 10*3/uL (ref 0.0–0.2)
Basos: 1 %
EOS (ABSOLUTE): 0.1 10*3/uL (ref 0.0–0.4)
Eos: 2 %
Hematocrit: 44.2 % (ref 37.5–51.0)
Hemoglobin: 15.2 g/dL (ref 13.0–17.7)
Immature Grans (Abs): 0 10*3/uL (ref 0.0–0.1)
Immature Granulocytes: 0 %
Lymphocytes Absolute: 1.6 10*3/uL (ref 0.7–3.1)
Lymphs: 27 %
MCH: 30.5 pg (ref 26.6–33.0)
MCHC: 34.4 g/dL (ref 31.5–35.7)
MCV: 89 fL (ref 79–97)
Monocytes Absolute: 0.6 10*3/uL (ref 0.1–0.9)
Monocytes: 10 %
Neutrophils Absolute: 3.6 10*3/uL (ref 1.4–7.0)
Neutrophils: 60 %
Platelets: 262 10*3/uL (ref 150–450)
RBC: 4.98 x10E6/uL (ref 4.14–5.80)
RDW: 12.4 % (ref 11.6–15.4)
WBC: 6 10*3/uL (ref 3.4–10.8)

## 2023-11-16 LAB — CMP14+EGFR
ALT: 15 [IU]/L (ref 0–44)
AST: 21 [IU]/L (ref 0–40)
Albumin: 4.2 g/dL — ABNORMAL LOW (ref 4.3–5.2)
Alkaline Phosphatase: 87 [IU]/L (ref 44–121)
BUN/Creatinine Ratio: 13 (ref 9–20)
BUN: 8 mg/dL (ref 6–20)
Bilirubin Total: 0.5 mg/dL (ref 0.0–1.2)
CO2: 23 mmol/L (ref 20–29)
Calcium: 9.4 mg/dL (ref 8.7–10.2)
Chloride: 103 mmol/L (ref 96–106)
Creatinine, Ser: 0.64 mg/dL — ABNORMAL LOW (ref 0.76–1.27)
Globulin, Total: 2.2 g/dL (ref 1.5–4.5)
Glucose: 80 mg/dL (ref 70–99)
Potassium: 4.6 mmol/L (ref 3.5–5.2)
Sodium: 139 mmol/L (ref 134–144)
Total Protein: 6.4 g/dL (ref 6.0–8.5)
eGFR: 134 mL/min/{1.73_m2} (ref >59)

## 2023-11-16 LAB — VITAMIN B12: Vitamin B-12: 487 pg/mL (ref 232–1245)

## 2023-11-16 LAB — LIPID PANEL
Chol/HDL Ratio: 3.3 {ratio} (ref 0.0–5.0)
Cholesterol, Total: 138 mg/dL (ref 100–199)
HDL: 42 mg/dL (ref >39)
LDL Chol Calc (NIH): 77 mg/dL (ref 0–99)
Triglycerides: 102 mg/dL (ref 0–149)
VLDL Cholesterol Cal: 19 mg/dL (ref 5–40)

## 2023-11-16 LAB — IRON,TIBC AND FERRITIN PANEL
Ferritin: 31 ng/mL (ref 30–400)
Iron Saturation: 31 % (ref 15–55)
Iron: 53 ug/dL (ref 38–169)
Total Iron Binding Capacity: 172 ug/dL — ABNORMAL LOW (ref 250–450)
UIBC: 119 ug/dL (ref 111–343)

## 2023-11-16 LAB — MICROALBUMIN / CREATININE URINE RATIO
Creatinine, Urine: 87.6 mg/dL
Microalb/Creat Ratio: <3 mg/g{creat} (ref 0–29)
Microalbumin, Urine: <3 ug/mL

## 2023-11-16 LAB — VITAMIN D 25 HYDROXY (VIT D DEFICIENCY, FRACTURES): Vit D, 25-Hydroxy: 9.2 ng/mL — ABNORMAL LOW (ref 30.0–100.0)

## 2023-11-16 LAB — TSH+FREE T4
Free T4: 2.05 ng/dL — ABNORMAL HIGH (ref 0.82–1.77)
TSH: 0.015 u[IU]/mL — ABNORMAL LOW (ref 0.450–4.500)

## 2023-11-16 LAB — HEMOGLOBIN A1C
Est. average glucose Bld gHb Est-mCnc: 100 mg/dL
Hgb A1c MFr Bld: 5.1 % (ref 4.8–5.6)

## 2023-11-16 MED ORDER — PROCHLORPERAZINE EDISYLATE 10 MG/2ML IJ SOLN
10.0000 mg | Freq: Once | INTRAMUSCULAR | Status: AC
  Administered 2023-11-16: 10 mg via INTRAMUSCULAR
  Filled 2023-11-16: qty 2

## 2023-11-16 MED ORDER — KETOROLAC TROMETHAMINE 60 MG/2ML IM SOLN
15.0000 mg | Freq: Once | INTRAMUSCULAR | Status: AC
Start: 2023-11-16 — End: 2023-11-16
  Administered 2023-11-16: 15 mg via INTRAMUSCULAR
  Filled 2023-11-16: qty 2

## 2023-11-16 MED ORDER — DIPHENHYDRAMINE HCL 25 MG PO CAPS
25.0000 mg | ORAL_CAPSULE | Freq: Once | ORAL | Status: AC
  Administered 2023-11-16: 25 mg via ORAL
  Filled 2023-11-16: qty 1

## 2023-11-16 NOTE — ED Triage Notes (Signed)
Pt reports chronic headaches, fatigue and nose bleeds.  Pt saw PCP on Monday and has not heard back about labs but his results in my chart show his thyroid is not normal.

## 2023-11-16 NOTE — Discharge Instructions (Addendum)
You were seen today for migraines.  You should definitely follow-up with PCP for further comment on your workup.  As of this time there are no emergent processes that would need to be addressed in the ER.  CT is normal.  If you begin having worsening headache, chest pain, nausea vomiting, abdominal pain return to the ER for further evaluation.

## 2023-11-16 NOTE — ED Provider Notes (Signed)
[place] EMERGENCY DEPARTMENT AT [facility] Provider Note   CSN: [id number] Arrival date & time: 11/16/23  1728     History  Chief Complaint  Patient presents with   Headache    Jose Vincent is a 27 y.o. male.  The history is provided by the patient and a relative.  Headache Patient presents to the ED with 3-week complaint of left-sided headache..  History of hypertriglyceridemia, tobacco use, obesity.  This had been intermittent but has been constant for 1 week.  Feels pulsatile and is accompanied with light and sound sensitivity.  He states was also completed by dizziness with 1 episode happening earlier today.  Which is the reason why he is coming into the ER today.  He states that the headache is usually worse in the mornings and tapers off in the day but can be agitated by light and sound has been occasionally accompanied by nausea.  Denies fever, tinnitus, vision changes, shortness of breath, chest pain, abdominal pain, diarrhea, constipation, dysuria, hematuria.      Home Medications Prior to Admission medications   Not on File      Allergies    Amoxicillin and Amoxicillin-pot clavulanate    Review of Systems   Review of Systems  Neurological:  Positive for headaches.    Physical Exam Updated Vital Signs BP 137/84 (BP Location: Right Arm)   Pulse 61   Temp 98 F (36.7 C) (Oral)   Resp 16   Ht 5\' 6"  (1.676 m)   Wt 108.9 kg   SpO2 99%   BMI 38.74 kg/m  Physical Exam Vitals and nursing note reviewed.  Constitutional:      Appearance: Normal appearance.  HENT:     Head: Normocephalic and atraumatic.     Right Ear: Tympanic membrane, ear canal and external ear normal. There is no impacted cerumen.     Left Ear: Tympanic membrane, ear canal and external ear normal. There is no impacted cerumen.     Nose: Nose normal. No congestion or rhinorrhea.     Mouth/Throat:     Mouth: Mucous membranes are moist.     Pharynx: Oropharynx is clear. No  oropharyngeal exudate or posterior oropharyngeal erythema.  Eyes:     General: No scleral icterus.       Right eye: No discharge.        Left eye: No discharge.     Extraocular Movements: Extraocular movements intact.     Conjunctiva/sclera: Conjunctivae normal.  Cardiovascular:     Rate and Rhythm: Normal rate and regular rhythm.     Pulses: Normal pulses.     Heart sounds: Normal heart sounds. No murmur heard.    No friction rub. No gallop.  Pulmonary:     Effort: Pulmonary effort is normal. No respiratory distress.     Breath sounds: Normal breath sounds. No stridor. No wheezing or rales.  Abdominal:     General: Abdomen is flat. There is no distension.     Palpations: Abdomen is soft.     Tenderness: There is no abdominal tenderness.  Musculoskeletal:        General: No swelling, tenderness, deformity or signs of injury.     Cervical back: No rigidity or tenderness.     Right lower leg: No edema.     Left lower leg: No edema.  Skin:    General: Skin is warm and dry.  Neurological:     General: No focal deficit present.  Mental Status: He is alert. Mental status is at baseline.  Psychiatric:        Mood and Affect: Mood normal.     ED Results / Procedures / Treatments   Labs (all labs ordered are listed, but only abnormal results are displayed) Labs Reviewed - No data to display  EKG None  Radiology CT Head Wo Contrast Result Date: 11/16/2023 CLINICAL DATA:  Headache, increasing frequency or severity. EXAM: CT HEAD WITHOUT CONTRAST TECHNIQUE: Contiguous axial images were obtained from the base of the skull through the vertex without intravenous contrast. RADIATION DOSE REDUCTION: This exam was performed according to the departmental dose-optimization program which includes automated exposure control, adjustment of the mA and/or kV according to patient size and/or use of iterative reconstruction technique. COMPARISON:  None Available. FINDINGS: Brain: No acute  intracranial hemorrhage. Checketts-white differentiation is preserved. No hydrocephalus or extra-axial collection. No mass effect or midline shift. Vascular: No hyperdense vessel or unexpected calcification. Skull: No calvarial fracture or suspicious bone lesion. Skull base is unremarkable. Sinuses/Orbits: No acute finding. Other: None. IMPRESSION: No acute intracranial abnormality. Electronically Signed   By: Orvan Falconer M.D.   On: 11/16/2023 21:42    Procedures Procedures    Medications Ordered in ED Medications  diphenhydrAMINE (BENADRYL) capsule 25 mg (25 mg Oral Given 11/16/23 2146)  prochlorperazine (COMPAZINE) injection 10 mg (10 mg Intramuscular Given 11/16/23 2146)  ketorolac (TORADOL) injection 15 mg (15 mg Intramuscular Given 11/16/23 2145)    ED Course/ Medical Decision Making/ A&P   {                                 Medical Decision Making Amount and/or Complexity of Data Reviewed Radiology: ordered.  Risk Prescription drug management.   This patient is a 27 year old male who presents to the ED for concern of 3-week long history of headache with dizziness starting today.   Differential diagnoses prior to evaluation: The emergent differential diagnosis includes, but is not limited to, migraines, malignancy, subarachnoid hemorrhage, tension headache, cluster headache.  This is not an exhaustive differential.   Past Medical History / Co-morbidities / Social History: Obesity, tobacco use disorder up  Additional history: Chart reviewed. Pertinent results include: Previously seen by PCP and has extensive lab testing done within the last 3 days showing possible hypothyroidism with low TSH and high free T4.  Labs seem to be within normal limits.  Lab Tests/Imaging studies: I personally interpreted labs/imaging and the pertinent results include:  .  CT of head showed no acute abnormalities, unremarkable.  I agree with the radiologist interpretation.   Medications: I  ordered medication including diphenhydramine, Compazine, Toradol given for headache..  I have reviewed the patients home medicines and have made adjustments as needed.   ED Course:  Patient is 27 year old male presents with his girlfriend today for 3-week long history of intermittent headaches that has begun to be constant for the last week and accompanied by dizziness, auditory and visual sensitivity starting today.  He says that headaches were worse in the morning and accompanied with occasional nausea.  Extensive workup by PCP also added.  For this reason I believe the patient warranted a CT of head to rule out any acute changes as no CT has been done prior.  Denies any tinnitus, hearing loss, visual changes.  CT of head showed no acute abnormality, unremarkable.  Provided headache cocktail which the patient responded well.  Advised  patient continue to follow-up with PCP for labs as this headache is probably a migraine.  Girlfriend states that he drinks 2 monsters a day and does not drink any water.  This is likely the cause of the headache.  Told patient to continue with hydration, reduce Monster energy drink intake and continue with Tylenol and ibuprofen for pain relief.  Also alerted him to continue to follow-up with PCP for management of possible hyperthyroidism and lab evaluation.  The patient appears stable to be discharged.  Patient expressed understanding and agreement with plan.  Disposition: After consideration of the diagnostic results and the patients response to treatment, I feel that patient benefit from discharge and treatment noted as above.   emergency department workup does not suggest an emergent condition requiring admission or immediate intervention beyond what has been performed at this time. The plan is: Symptomatic treatment of headache with chronic management managed by PCP, return to ER for worsening symptoms, follow-up with PCP for lab evaluation.. The patient is safe for  discharge and has been instructed to return immediately for worsening symptoms, change in symptoms or any other concerns.   Final Clinical Impression(s) / ED Diagnoses Final diagnoses:  Migraine without status migrainosus, not intractable, unspecified migraine type    Rx / DC Orders ED Discharge Orders     None         Lavonia Drafts 11/16/23 2333    Vanetta Mulders, MD 11/19/23 1116

## 2023-11-16 NOTE — ED Notes (Signed)
Patient transported to CT 

## 2023-11-16 NOTE — ED Notes (Signed)
Pt back in room.

## 2023-11-17 ENCOUNTER — Other Ambulatory Visit: Payer: Self-pay | Admitting: Family Medicine

## 2023-11-17 DIAGNOSIS — Z7689 Persons encountering health services in other specified circumstances: Secondary | ICD-10-CM

## 2023-11-17 DIAGNOSIS — E038 Other specified hypothyroidism: Secondary | ICD-10-CM

## 2023-12-10 ENCOUNTER — Encounter (INDEPENDENT_AMBULATORY_CARE_PROVIDER_SITE_OTHER): Payer: Self-pay | Admitting: Otolaryngology

## 2024-02-15 ENCOUNTER — Ambulatory Visit: Payer: Managed Care, Other (non HMO) | Admitting: Internal Medicine

## 2024-02-19 ENCOUNTER — Encounter: Payer: Self-pay | Admitting: Family Medicine

## 2024-05-14 ENCOUNTER — Ambulatory Visit: Payer: Managed Care, Other (non HMO) | Admitting: Family Medicine
